# Patient Record
Sex: Female | Born: 1980
Health system: Southern US, Community
[De-identification: ages and names within clinical notes are randomized; demographics above are authoritative.]

## PROBLEM LIST (undated history)

## (undated) DIAGNOSIS — E785 Hyperlipidemia, unspecified: Secondary | ICD-10-CM

## (undated) DIAGNOSIS — D72829 Elevated white blood cell count, unspecified: Secondary | ICD-10-CM

## (undated) DIAGNOSIS — O26619 Liver and biliary tract disorders in pregnancy, unspecified trimester: Secondary | ICD-10-CM

## (undated) DIAGNOSIS — F419 Anxiety disorder, unspecified: Secondary | ICD-10-CM

## (undated) DIAGNOSIS — K831 Obstruction of bile duct: Secondary | ICD-10-CM

## (undated) DIAGNOSIS — O26649 Intrahepatic cholestasis of pregnancy, unspecified trimester: Secondary | ICD-10-CM

## (undated) DIAGNOSIS — T7840XA Allergy, unspecified, initial encounter: Secondary | ICD-10-CM

## (undated) HISTORY — DX: Obstruction of bile duct: K83.1

## (undated) HISTORY — DX: Allergy, unspecified, initial encounter: T78.40XA

## (undated) HISTORY — PX: WISDOM TOOTH EXTRACTION: SHX21

## (undated) HISTORY — DX: Intrahepatic cholestasis of pregnancy, unspecified trimester: O26.649

## (undated) HISTORY — DX: Elevated white blood cell count, unspecified: D72.829

## (undated) HISTORY — DX: Anxiety disorder, unspecified: F41.9

## (undated) HISTORY — DX: Liver and biliary tract disorders in pregnancy, unspecified trimester: O26.619

---

## 2012-06-18 LAB — HM PAP SMEAR: HM PAP: NEGATIVE

## 2016-04-11 ENCOUNTER — Encounter: Payer: Self-pay | Admitting: Osteopathic Medicine

## 2016-04-11 ENCOUNTER — Other Ambulatory Visit: Payer: Self-pay | Admitting: Osteopathic Medicine

## 2016-04-11 ENCOUNTER — Ambulatory Visit (INDEPENDENT_AMBULATORY_CARE_PROVIDER_SITE_OTHER): Payer: BLUE CROSS/BLUE SHIELD | Admitting: Osteopathic Medicine

## 2016-04-11 VITALS — BP 118/77 | HR 69 | Ht 65.0 in | Wt 148.0 lb

## 2016-04-11 DIAGNOSIS — Z79899 Other long term (current) drug therapy: Secondary | ICD-10-CM

## 2016-04-11 DIAGNOSIS — F411 Generalized anxiety disorder: Secondary | ICD-10-CM

## 2016-04-11 DIAGNOSIS — R748 Abnormal levels of other serum enzymes: Secondary | ICD-10-CM | POA: Diagnosis not present

## 2016-04-11 DIAGNOSIS — Z113 Encounter for screening for infections with a predominantly sexual mode of transmission: Secondary | ICD-10-CM | POA: Diagnosis not present

## 2016-04-11 DIAGNOSIS — Z13220 Encounter for screening for lipoid disorders: Secondary | ICD-10-CM | POA: Diagnosis not present

## 2016-04-11 LAB — CBC WITH DIFFERENTIAL/PLATELET
BASOS ABS: 83 {cells}/uL (ref 0–200)
Basophils Relative: 1 %
EOS PCT: 5 %
Eosinophils Absolute: 415 cells/uL (ref 15–500)
HEMATOCRIT: 45.5 % — AB (ref 35.0–45.0)
HEMOGLOBIN: 14.9 g/dL (ref 11.7–15.5)
LYMPHS ABS: 1992 {cells}/uL (ref 850–3900)
LYMPHS PCT: 24 %
MCH: 32.5 pg (ref 27.0–33.0)
MCHC: 32.7 g/dL (ref 32.0–36.0)
MCV: 99.3 fL (ref 80.0–100.0)
MPV: 11.5 fL (ref 7.5–12.5)
Monocytes Absolute: 913 cells/uL (ref 200–950)
Monocytes Relative: 11 %
NEUTROS PCT: 59 %
Neutro Abs: 4897 cells/uL (ref 1500–7800)
Platelets: 277 10*3/uL (ref 140–400)
RBC: 4.58 MIL/uL (ref 3.80–5.10)
RDW: 12.9 % (ref 11.0–15.0)
WBC: 8.3 10*3/uL (ref 3.8–10.8)

## 2016-04-11 LAB — COMPLETE METABOLIC PANEL WITH GFR
ALBUMIN: 4.7 g/dL (ref 3.6–5.1)
ALK PHOS: 52 U/L (ref 33–115)
ALT: 69 U/L — ABNORMAL HIGH (ref 6–29)
AST: 39 U/L — ABNORMAL HIGH (ref 10–30)
BILIRUBIN TOTAL: 0.7 mg/dL (ref 0.2–1.2)
BUN: 11 mg/dL (ref 7–25)
CALCIUM: 9.4 mg/dL (ref 8.6–10.2)
CHLORIDE: 101 mmol/L (ref 98–110)
CO2: 25 mmol/L (ref 20–31)
Creat: 0.73 mg/dL (ref 0.50–1.10)
GFR, Est African American: >89 mL/min (ref >=60)
GLUCOSE: 77 mg/dL (ref 65–99)
POTASSIUM: 4.1 mmol/L (ref 3.5–5.3)
Sodium: 136 mmol/L (ref 135–146)
Total Protein: 7.2 g/dL (ref 6.1–8.1)

## 2016-04-11 LAB — LIPID PANEL
Cholesterol: 272 mg/dL — ABNORMAL HIGH (ref 125–200)
HDL: 77 mg/dL (ref >=46)
LDL Cholesterol: 180 mg/dL — ABNORMAL HIGH (ref <130)
Total CHOL/HDL Ratio: 3.5 Ratio (ref <=5.0)
Triglycerides: 76 mg/dL (ref <150)
VLDL: 15 mg/dL (ref <30)

## 2016-04-11 LAB — TSH: TSH: 1.65 m[IU]/L

## 2016-04-11 LAB — POCT URINE PREGNANCY: Preg Test, Ur: NEGATIVE

## 2016-04-11 MED ORDER — SERTRALINE HCL 50 MG PO TABS: 50.0000 mg | ORAL_TABLET | Freq: Every day | ORAL | Status: DC

## 2016-04-11 MED ORDER — BUSPIRONE HCL 10 MG PO TABS: 5.0000 mg | ORAL_TABLET | Freq: Three times a day (TID) | ORAL | Status: DC | PRN

## 2016-04-11 NOTE — Patient Instructions (Addendum)
DR. Mardelle MatteALEXANDER'S IMPORTANT  INFORMATION FOR NEW PATIENTS!  PLEASE REVIEW CAREFULLY!    FOLLOW-UP!   Let's plan to follow-up in the office in 4 - 6 weeks for recheck of Anxiety.   Long term, let's plan to follow-up every 6-12 months for management/monitoring of chronic medical issues such as Anxiety, as well as management of your prescription medications. More freqtuent visits may be needed until we get you on a stable dose of anti-anxiety medicines which are working for you.   Please don't hesitate to make an appointment sooner if you're having any acute concerns or problems!  REGARDING PRESCRIPTION MEDICATIONS  Please let your pharmacy know when you are running low on medications/refills (do not wait until you are out of medicines). Your pharmacy will send our office a request for the appropriate medications. Please allow our office 2-3 business days to process the needed refills.   For controlled substances, you may be asked to schedule an office visit and/or undergo a random urine drug screen for continuation of such medications.   REGARDING ANY CARE YOU RECEIVE OUTSIDE OUR OFFICE  At any visits to any specialists, or if you receive vaccines anywhere outside our office, please provide our clinic information so that they can forward us any records, including any tests which are done, changes to your medications, or new vaccinations.   If you are a woman who has Pap testing and/or Mammograms done at your OBGYN, please have them forward your results to us!   Also, if you are ever treated in an emergency room or if you are admitted to the hospital, we encourage our patients to schedule a follow-up visit any time they are treated for a serious illness or injury.   This allows all your physicians to communicate effectively, putting your primary care doctor at the center of your medical care and allowing us to coordinate your care.  REGARDING PREVENTIVE CARE & WELLNESS, AKA "ANNUAL  PHYSICAL"  Let's plan to follow-up here in the office in 12 months for ANNUAL WELLNESS & PREVENTIVE CARE PHYSICAL.   This visit is very important to make sure we talk with our patients about all recommended cancer screenings, vaccines, birth control (if desired) and screenings for other diseases based on your personal/family history. Plus, this visit should be completely covered under your insurance!   We also like to talk about any changes to your health over the past year and make sure any chronic conditions are well-controlled.    If you would like to, you can get routine lab work done 2 - 3 days before your physical so that we can go over the results in person at your appointment. Please call our office a week before that appointment so we can make sure the lab has the appropriate orders for your blood draw.   Please note: insurance should completely cover one preventive care visit annually, and they should completely cover most tests associated with preventive care such as routine labs, mammograms, colon cancer screening, etc. If you have any other medical concerns to address at the time of your annual wellness visit, you may be asked to reschedule your annual physical, or schedule a separate visit to address other medical concerns. Or, you may be billed for care related to "problem-based visit" in addition to your "preventive care visit." If you have questions about this, please contact our office, your insurance company, or [facility] billing department.   REGARDING ANY FORMS NEEDING YOUR DOCTOR'S SIGNATURE  If you ever  have any paperwork which needs to be completed by your PCP, you may be asked to come to the office if that paperwork requires a complex review of your medical history. We want to make sure everything is completed to your satisfaction and is completed correctly the first time, particularly with FMLA or other employment/legal matters!      Please let us know if there is  anything else we can do for you. Take care! -Dr. Mervyn SkeetersA.

## 2016-04-11 NOTE — Addendum Note (Signed)
Addended by: Deirdre PippinsALEXANDER, Kolten Ryback M on: 04/11/2016 01:35 PM   Modules accepted: Level of Service

## 2016-04-11 NOTE — Progress Notes (Signed)
HPI: Anna Hines is a 35 y.o. female who presents [facility] [place] today for chief complaint of:  Chief Complaint  Patient presents with  . Establish Care    ANNUAL  . Anxiety     ANXIETY . Quality: anxious, trouble focusing . Duration: longstanding anxiety/depression issues  . Modifying factors: previously prescribed Sertraline and was doing kind of ok on this. Off for a few months. Not sure dose.  . Assoc signs/symptoms: marital problems  CONTRACEPTION - long-term partner has vasectomy  PREVENTIVE CARE - reviewed as below   Past medical, social and family history reviewed: History reviewed. No pertinent past medical history. History reviewed. No pertinent past surgical history. Social History  Substance Use Topics  . Smoking status: Never Smoker   . Smokeless tobacco: Not on file  . Alcohol Use: Not on file   Family History  Problem Relation Age of Onset  . Cancer Mother     CERVICAL  . Hyperlipidemia Father   . Alcohol abuse Maternal Grandmother   . Cancer Maternal Grandmother     BREAST  . Stroke Maternal Grandmother   . Diabetes Maternal Grandfather   . Hyperlipidemia Paternal Grandmother   . Heart attack Paternal Grandfather     No current outpatient prescriptions on file.   No current facility-administered medications for this visit.   No Known Allergies    Review of Systems: CONSTITUTIONAL:  No  fever, no chills, No recent illness, No unintentional weight changes HEAD/EYES/EARS/NOSE/THROAT: No  headache, no vision change, no hearing change, No sore throat, No  sinus pressure CARDIAC: No  chest pain, No  pressure, No palpitations, No  orthopnea RESPIRATORY: No  cough, No  shortness of breath/wheeze GASTROINTESTINAL: No  nausea, No  vomiting, No  abdominal pain, No  blood in stool, No  diarrhea, No  constipation  MUSCULOSKELETAL: No  myalgia/arthralgia GENITOURINARY: No  incontinence, No  abnormal genital  bleeding/discharge SKIN: No  rash/wounds/concerning lesions HEM/ONC: No  easy bruising/bleeding, No  abnormal lymph node ENDOCRINE: No polyuria/polydipsia/polyphagia, No  heat/cold intolerance  NEUROLOGIC: No  weakness, No  dizziness, No  slurred speech PSYCHIATRIC: No  concerns with depression, (+) concerns with anxiety, No sleep problems  Exam:  BP 118/77 mmHg  Pulse 69  Ht 5\' 5"  (1.651 m)  Wt 148 lb (67.132 kg)  BMI 24.63 kg/m2 Constitutional: VS see above. General Appearance: alert, well-developed, well-nourished, NAD Eyes: Normal lids and conjunctive, non-icteric sclera, PERRLA Ears, Nose, Mouth, Throat: MMM, Normal external inspection ears/nares/mouth/lips/gums, TM normal bilaterally. Pharynx/tonsils no erythema, no exudate. Nasal mucosa normal.  Neck: No masses, trachea midline. No thyroid enlargement. No tenderness/mass appreciated. No lymphadenopathy Respiratory: Normal respiratory effort. no wheeze, no rhonchi, no rales Cardiovascular: S1/S2 normal, no murmur, no rub/gallop auscultated. RRR. No lower extremity edema. Gastrointestinal: Nontender, no masses. No hepatomegaly, no splenomegaly. No hernia appreciated. Bowel sounds normal. Rectal exam deferred.  Musculoskeletal: Gait normal. No clubbing/cyanosis of digits.  Neurological: No cranial nerve deficit on limited exam. Motor and sensation intact and symmetric Skin: warm, dry, intact. No rash/ulcer. No concerning nevi or subq nodules on limited exam.   Psychiatric: Normal judgment/insight. Normal mood and affect. Oriented x3.    No results found for this or any previous visit (from the past 72 hour(s)).  No results found.   ASSESSMENT/PLAN:  Generalized anxiety disorder - Plan: CBC with Differential/Platelet, COMPLETE METABOLIC PANEL WITH GFR, TSH, sertraline (ZOLOFT) 50 MG tablet, busPIRone (BUSPAR) 10 MG tablet  Routine screening for  STI (sexually transmitted infection) - Plan: HIV antibody, GC/chlamydia probe amp,  urine, Trichomonas vaginalis, RNA  Lipid screening - Plan: Lipid panel  Medication management - Plan: CBC with Differential/Platelet, COMPLETE METABOLIC PANEL WITH GFR, POCT urine pregnancy   FEMALE PREVENTIVE CARE  ANNUAL SCREENING/COUNSELING Tobacco - yesCurrent  1/5 pack / day  Alcohol - social drinker Diet/Exercise - HEALTHY HABITS DISCUSSED TO DECREASE CV RISK Depression - PQH2 Negative Domestic violence concerns - no HTN SCREENING - SEE VITALS Vaccination status - SEE BELOW  SEXUAL HEALTH Sexually active in the past year - yes With - Yes with female. STI - The patient denies history of sexually transmitted disease. STI testing today? - yes  INFECTIOUS DISEASE SCREENING HIV - all adults 15-65 - needs GC/CT - sexually active - needs HepC - DOB [birth year] - does not need TB - does not need  DISEASE SCREENING Lipid - needs DM2 - does not need Osteoporosis - does not need  CANCER SCREENING Cervical - does not need - WE NEED RECORDS Breast - does not need Lung - does not need Colon - does not need  ADULT VACCINATION Influenza - was offered and declined by the patient Td - already has, per patient HPV - was not indicated Zoster - was not indicated Pneumonia - was not indicated  OTHER Fall - exercise and Vit D age 39+ - does not need Consider ASA - age 33-59 - does not need    All questions were answered. Visit summary with medication list and pertinent instructions was printed for patient to review. ER/RTC precautions were reviewed with the patient. Return in about 6 weeks (around 05/23/2016), or sooner if needed, for MEDICATION MANAGEMENT, RECHECK ANXIETY.

## 2016-04-12 LAB — GC/CHLAMYDIA PROBE AMP
CT PROBE, AMP APTIMA: NOT DETECTED
GC PROBE AMP APTIMA: NOT DETECTED

## 2016-04-12 LAB — HIV ANTIBODY (ROUTINE TESTING W REFLEX): HIV: NONREACTIVE

## 2016-04-12 LAB — TRICHOMONAS VAGINALIS, PROBE AMP: TRICHOMONAS VAGINALIS PROBE APTIMA: NEGATIVE

## 2016-04-15 NOTE — Addendum Note (Signed)
Addended by: Deirdre PippinsALEXANDER, Cesiah Westley M on: 04/15/2016 08:31 AM   Modules accepted: Orders

## 2016-04-18 ENCOUNTER — Encounter: Payer: Self-pay | Admitting: Osteopathic Medicine

## 2016-04-18 DIAGNOSIS — Z8639 Personal history of other endocrine, nutritional and metabolic disease: Secondary | ICD-10-CM | POA: Insufficient documentation

## 2016-04-18 DIAGNOSIS — Z7189 Other specified counseling: Secondary | ICD-10-CM | POA: Insufficient documentation

## 2016-05-02 ENCOUNTER — Encounter: Payer: Self-pay | Admitting: Osteopathic Medicine

## 2016-05-23 ENCOUNTER — Ambulatory Visit: Payer: BLUE CROSS/BLUE SHIELD | Admitting: Osteopathic Medicine

## 2016-05-23 DIAGNOSIS — Z5329 Procedure and treatment not carried out because of patient's decision for other reasons: Secondary | ICD-10-CM | POA: Insufficient documentation

## 2016-05-23 DIAGNOSIS — Z91199 Patient's noncompliance with other medical treatment and regimen due to unspecified reason: Secondary | ICD-10-CM | POA: Insufficient documentation

## 2016-09-03 ENCOUNTER — Encounter: Payer: Self-pay | Admitting: Osteopathic Medicine

## 2016-09-03 ENCOUNTER — Ambulatory Visit (INDEPENDENT_AMBULATORY_CARE_PROVIDER_SITE_OTHER): Payer: BLUE CROSS/BLUE SHIELD | Admitting: Osteopathic Medicine

## 2016-09-03 VITALS — BP 126/81 | HR 91 | Ht 65.0 in | Wt 153.0 lb

## 2016-09-03 DIAGNOSIS — Z30011 Encounter for initial prescription of contraceptive pills: Secondary | ICD-10-CM | POA: Insufficient documentation

## 2016-09-03 DIAGNOSIS — Z113 Encounter for screening for infections with a predominantly sexual mode of transmission: Secondary | ICD-10-CM

## 2016-09-03 LAB — HIV ANTIBODY (ROUTINE TESTING W REFLEX): HIV 1&2 Ab, 4th Generation: NONREACTIVE

## 2016-09-03 LAB — POCT URINE PREGNANCY: PREG TEST UR: NEGATIVE

## 2016-09-03 MED ORDER — NORGESTIM-ETH ESTRAD TRIPHASIC 0.18/0.215/0.25 MG-25 MCG PO TABS: 1.0000 | ORAL_TABLET | Freq: Every day | ORAL | 3 refills | Status: DC

## 2016-09-03 NOTE — Progress Notes (Signed)
HPI: Anna FriezeSarah R Hines is a 35 y.o. female  who [facility] today, 09/03/16,  for chief complaint of:  Chief Complaint  Patient presents with  . Follow-up    anxiety     . Stopped taking Zoloft, has gotten out of a bad marriage and no longer having anxiety problems.  . Today would like to discuss initiation of birth control pills, previously on Ortho Tri-Cyclen Lo and did well with this medication. Would also like screening for sexually transmitted infections.     Past medical, surgical, social and family history reviewed: Patient Active Problem List   Diagnosis Date Noted  . Failure to attend appointment 05/23/2016  . Counseling on health promotion and disease prevention 04/18/2016  . History of hyperlipidemia 04/18/2016  . Generalized anxiety disorder 04/11/2016   No past surgical history on file. Social History  Substance Use Topics  . Smoking status: Former Games developermoker  . Smokeless tobacco: Not on file  . Alcohol use Not on file   Family History  Problem Relation Age of Onset  . Cancer Mother     CERVICAL  . Hyperlipidemia Father   . Alcohol abuse Maternal Grandmother   . Cancer Maternal Grandmother     BREAST  . Stroke Maternal Grandmother   . Diabetes Maternal Grandfather   . Hyperlipidemia Paternal Grandmother   . Heart attack Paternal Grandfather      Current medication list and allergy/intolerance information reviewed:   Current Outpatient Prescriptions on File Prior to Visit  Medication Sig Dispense Refill  . busPIRone (BUSPAR) 10 MG tablet Take 0.5-1 tablets (5-10 mg total) by mouth 3 (three) times daily as needed (anxiety). 30 tablet 0   No current facility-administered medications on file prior to visit.    No Known Allergies    Review of Systems:  Constitutional: No recent illness  Cardiac: No  chest pain  Respiratory:  No  shortness of breath.   Psychiatric: No  concerns with depression, No   concerns with anxiety  Exam:  BP 126/81   Pulse 91   Ht 5\' 5"  (1.651 m)   Wt 153 lb (69.4 kg)   BMI 25.46 kg/m   Constitutional: VS see above. General Appearance: alert, well-developed, well-nourished, NAD  Eyes: Normal lids and conjunctive, non-icteric sclera  Ears, Nose, Mouth, Throat: MMM, Normal external inspection ears/nares/mouth/lips/gums.  Neck: No masses, trachea midline.   Respiratory: Normal respiratory effort.   Musculoskeletal: Gait normal. Symmetric and independent movement of all extremities  Neurological: Normal balance/coordination. No tremor.  Skin: warm, dry, intact.   Psychiatric: Normal judgment/insight. Normal mood and affect. Oriented x3.    Results for orders placed or performed in visit on 09/03/16 (from the past 72 hour(s))  POCT urine pregnancy     Status: None   Collection Time: 09/03/16  9:14 AM  Result Value Ref Range   Preg Test, Ur Negative Negative    No results found.  GAD 7 : Generalized Anxiety Score 09/03/2016  Nervous, Anxious, on Edge 1  Control/stop worrying 0  Worry too much - different things 1  Trouble relaxing 0  Restless 0  Easily annoyed or irritable 0  Afraid - awful might happen 0  Total GAD 7 Score 2  Anxiety Difficulty Not difficult at all      Dep[facility] screen [facility] 09/03/2016  Decreased Interest 0  Down, Depressed, Hopeless 0  PHQ - 2 Score 0  Altered sleeping 0  Tired, decreased energy 0  Change in appetite 0  Feeling bad or failure about yourself  0  Trouble concentrating 0  Moving slowly or fidgety/restless 0  Suicidal thoughts 0  PHQ-9 Score 0     ASSESSMENT/PLAN:   Oral contraception initiation - Plan: POCT urine pregnancy, Norgestimate-Ethinyl Estradiol Triphasic (ORTHO TRI-CYCLEN LO) 0.18/0.215/0.25 MG-25 MCG tab  Routine screening for STI (sexually transmitted infection) - Plan: HIV antibody, GC/Chlamydia Probe Amp, Urine cytology ancillary only      Visit summary with medication  list and pertinent instructions was printed for patient to review. All questions at time of visit were answered - patient instructed to contact office with any additional concerns. ER/RTC precautions were reviewed with the patient. Follow-up plan: Return for Annual physical June 2018, sooner if needed.

## 2016-09-04 LAB — GC/CHLAMYDIA PROBE AMP
CT PROBE, AMP APTIMA: NOT DETECTED
GC Probe RNA: NOT DETECTED

## 2016-10-20 ENCOUNTER — Emergency Department (INDEPENDENT_AMBULATORY_CARE_PROVIDER_SITE_OTHER)
Admission: EM | Admit: 2016-10-20 | Discharge: 2016-10-20 | Disposition: A | Discharge status: Home / Self Care | Payer: BLUE CROSS/BLUE SHIELD | Source: Home / Self Care | Attending: Family Medicine | Admitting: Family Medicine

## 2016-10-20 ENCOUNTER — Encounter: Payer: Self-pay | Admitting: Emergency Medicine

## 2016-10-20 DIAGNOSIS — R062 Wheezing: Secondary | ICD-10-CM

## 2016-10-20 DIAGNOSIS — J069 Acute upper respiratory infection, unspecified: Secondary | ICD-10-CM | POA: Diagnosis not present

## 2016-10-20 HISTORY — DX: Hyperlipidemia, unspecified: E78.5

## 2016-10-20 MED ORDER — AZITHROMYCIN 250 MG PO TABS: 250.0000 mg | ORAL_TABLET | Freq: Every day | ORAL | 0 refills | Status: DC

## 2016-10-20 MED ORDER — ALBUTEROL SULFATE HFA 108 (90 BASE) MCG/ACT IN AERS: 1.0000 | INHALATION_SPRAY | Freq: Four times a day (QID) | RESPIRATORY_TRACT | 0 refills | Status: DC | PRN

## 2016-10-20 MED ORDER — BENZONATATE 100 MG PO CAPS: 100.0000 mg | ORAL_CAPSULE | Freq: Three times a day (TID) | ORAL | 0 refills | Status: DC

## 2016-10-20 NOTE — ED Triage Notes (Signed)
Patient reports onset of congestion and cough 5 days ago; now cough leads to wheezing. No known fever or aches. Last OTC at bedtime last night.

## 2016-10-20 NOTE — ED Provider Notes (Signed)
CSN: [id number]     Arrival date & time 10/20/16  1147 History   First MD Initiated Contact with Patient 10/20/16 1218     Chief Complaint  Patient presents with  . Cough  . Wheezing   (Consider location/radiation/quality/duration/timing/severity/associated sxs/prior Treatment) HPI  Anna Hines is a 35 y.o. female presenting to UC with c/o worsening cough and congestion that started 5 days ago, associated wheeze started last night with chest tightness. She has tried OTC cough/cold medication with mild temporary relief.  No hx of asthma or COPD.  Denies fever, chills, aches, n/v/d.     Past Medical History:  Diagnosis Date  . Hyperlipidemia    History reviewed. No pertinent surgical history. Family History  Problem Relation Age of Onset  . Cancer Mother     CERVICAL  . Hyperlipidemia Father   . Alcohol abuse Maternal Grandmother   . Cancer Maternal Grandmother     BREAST  . Stroke Maternal Grandmother   . Diabetes Maternal Grandfather   . Hyperlipidemia Paternal Grandmother   . Heart attack Paternal Grandfather    Social History  Substance Use Topics  . Smoking status: Former Games developer  . Smokeless tobacco: Never Used  . Alcohol use Not on file   OB History    No data available     Review of Systems  Constitutional: Negative for chills and fever.  HENT: Positive for congestion and rhinorrhea. Negative for ear pain, sore throat, trouble swallowing and voice change.   Respiratory: Positive for cough, chest tightness and wheezing. Negative for shortness of breath.   Cardiovascular: Negative for chest pain and palpitations.  Gastrointestinal: Negative for abdominal pain, diarrhea, nausea and vomiting.  Musculoskeletal: Negative for arthralgias, back pain and myalgias.  Skin: Negative for rash.    Allergies  Patient has no known allergies.  Home Medications   Prior to Admission medications   Medication Sig Start Date End Date Taking? Authorizing Provider   albuterol (PROVENTIL HFA;VENTOLIN HFA) 108 (90 Base) MCG/ACT inhaler Inhale 1-2 puffs into the lungs every 6 (six) hours as needed for wheezing or shortness of breath. 10/20/16   Junius Finner, PA-C  azithromycin (ZITHROMAX) 250 MG tablet Take 1 tablet (250 mg total) by mouth daily. Take first 2 tablets together, then 1 every day until finished. 10/20/16   Junius Finner, PA-C  benzonatate (TESSALON) 100 MG capsule Take 1-2 capsules (100-200 mg total) by mouth every 8 (eight) hours. 10/20/16   Junius Finner, PA-C  busPIRone (BUSPAR) 10 MG tablet Take 0.5-1 tablets (5-10 mg total) by mouth 3 (three) times daily as needed (anxiety). 04/11/16   Sunnie Nielsen, DO  Norgestimate-Ethinyl Estradiol Triphasic (ORTHO TRI-CYCLEN LO) 0.18/0.215/0.25 MG-25 MCG tab Take 1 tablet by mouth daily. 09/03/16   Sunnie Nielsen, DO   Meds Ordered and Administered this Visit  Medications - No data to display  BP 115/77 (BP Location: Left Arm)   Pulse 95   Temp 98.6 F (37 C) (Oral)   Resp 16   Ht  (1.651 m)   Wt 150 lb (68 kg)   LMP 10/06/2016 (Exact Date)   SpO2 96%   BMI 24.96 kg/m  No data found.   Physical Exam  Constitutional: She is oriented to person, place, and time. She appears well-developed and well-nourished. No distress.  HENT:  Head: Normocephalic and atraumatic.  Right Ear: Tympanic membrane normal.  Left Ear: Tympanic membrane normal.  Nose: Mucosal edema present.  Mouth/Throat: Uvula is midline, oropharynx is clear and  moist and mucous membranes are normal.  Eyes: EOM are normal.  Neck: Normal range of motion. Neck supple.  Cardiovascular: Normal rate and regular rhythm.   Pulmonary/Chest: Effort normal. No stridor. No respiratory distress. She has wheezes ( Left upper lung field ). She has no rales.  Musculoskeletal: Normal range of motion.  Lymphadenopathy:    She has no cervical adenopathy.  Neurological: She is alert and oriented to person, place, and time.  Skin:  Skin is warm and dry. She is not diaphoretic.  Psychiatric: She has a normal mood and affect. Her behavior is normal.  Nursing note and vitals reviewed.   Urgent Care Course   Clinical Course     Procedures (including critical care time)  Labs Review Labs Reviewed - No data to display  Imaging Review No results found.    MDM   1. Upper respiratory tract infection, unspecified type   2. Wheezing    Pt c/o worsening cough for 1 week, developing wheeze.  No respiratory distress. O2 Sat 96% on RA  Rx: Tessalon, azithromycin, albuterol  Encouraged f/u with PCP in 1 week if not improving.     Junius Finnerrin O'Malley, PA-C 10/20/16 1535

## 2016-11-26 ENCOUNTER — Ambulatory Visit (INDEPENDENT_AMBULATORY_CARE_PROVIDER_SITE_OTHER): Payer: BLUE CROSS/BLUE SHIELD | Admitting: Osteopathic Medicine

## 2016-11-26 ENCOUNTER — Ambulatory Visit (INDEPENDENT_AMBULATORY_CARE_PROVIDER_SITE_OTHER): Payer: BLUE CROSS/BLUE SHIELD

## 2016-11-26 VITALS — BP 108/71 | HR 85 | Temp 98.2°F | Ht 65.0 in | Wt 156.0 lb

## 2016-11-26 DIAGNOSIS — J039 Acute tonsillitis, unspecified: Secondary | ICD-10-CM

## 2016-11-26 DIAGNOSIS — R062 Wheezing: Secondary | ICD-10-CM

## 2016-11-26 DIAGNOSIS — R05 Cough: Secondary | ICD-10-CM | POA: Diagnosis not present

## 2016-11-26 DIAGNOSIS — R0602 Shortness of breath: Secondary | ICD-10-CM | POA: Diagnosis not present

## 2016-11-26 MED ORDER — AMOXICILLIN-POT CLAVULANATE 875-125 MG PO TABS: 1.0000 | ORAL_TABLET | Freq: Two times a day (BID) | ORAL | 0 refills | Status: DC

## 2016-11-26 MED ORDER — PREDNISONE 20 MG PO TABS: 20.0000 mg | ORAL_TABLET | Freq: Two times a day (BID) | ORAL | 0 refills | Status: DC

## 2016-11-26 NOTE — Progress Notes (Addendum)
HPI: Anna Hines is a 36 y.o. female who presents to [facility] 11/26/16 for chief complaint of:  Chief Complaint  Patient presents with  . Wheezing    Acute Illness: . Context:  Went to [place], see records below. She is feeling much better except feeling some "wheezing"  . Location: feels like it's in the throat  . Quality: whistling type noise in the throat, no trouble swallowing, feels like throat is swollen on R side, feels tender with swallowing but that is new, last Thursday that started  . Assoc signs/symptoms: see ROS . Modifying factors: has tried the following OTC/Rx medications: Allegra for the throat  Records reviewed from 10/20/2016: Seen for acute illness in urgent care. Cough, congestion 5 days associated with wheeze/chest tightness. Diagnosed with upper respiratory tract infection, unspecified. Prescribed Tessalon, azithromycin, albuterol.   Past medical, social and family history reviewed.   Immune compromising conditions or other risk factors: none  Current medications and allergies reviewed.     Review of Systems:  Constitutional: No  fever/chills  HEENT: Yes headache but thinks due to quit caffeine, on sinus headache,  Yes  sore throat, Yes  swollen glands  Cardiovascular: No chest pain, no palpitations   Respiratory: very occasional cough, when working out has experienced some shortness of breath   Gastrointestinal: No  nausea, No  vomiting,  No  diarrhea  Musculoskeletal:   No  myalgia/arthralgia  Skin/Integument:  No  rash   Detailed Exam:  BP 108/71   Pulse 85   Temp 98.2 F (36.8 C) (Oral)   Ht 5\' 5"  (1.651 m)   Wt 156 lb (70.8 kg)   SpO2 96%   BMI 25.96 kg/m   Constitutional:   VSS, see above.   General Appearance: alert, well-developed, well-nourished, NAD  Eyes:   Normal lids and conjunctive, non-icteric sclera  Ears, Nose, Mouth, Throat:   Relatively enlarged tonsil on R side, posterior  pharynx no edema/mass  No trismus, no drooling  Normal voice  Normal external inspection ears/nares  Normal mouth/lips/gums, MMM  normal TM  posterior pharynx with erythema, without exudate  nasal mucosa normal  Skin:  Normal inspection, no rash or concerning lesions noted on limited exam  Neck:   No masses, trachea midline. abnormal - nonenlarged but tender ant cervical LN on R lymph node, L LN nonpainful   Respiratory:   Normal respiratory effort.   Louisa Second  wheeze/rhonchi/rales  Cardiovascular:   S1/S2 normal, no murmur/rub/gallop auscultated. RRR.  Dg Chest 2 View  Result Date: 11/26/2016 CLINICAL DATA:  Cough, shortness of breath, wheezing EXAM: CHEST  2 VIEW COMPARISON:  None. FINDINGS: The heart size and mediastinal contours are within normal limits. Both lungs are clear. The visualized skeletal structures are unremarkable. IMPRESSION: No active cardiopulmonary disease. Electronically Signed   By: Elige Ko   On: 11/26/2016 09:10    ASSESSMENT/PLAN: Reassuring afebrile and nonsevere symptoms though certainly will need to monitor closely. Consider w/u for exercise induced asthma. ER precautions reviewed in detail with the patient. Will need CT if any worsening symptoms concerning for deeper infection but at this time I'm less suspicious of abscess, epiglottitis, or other such as HIV or mono.   Tonsillitis - Plan: amoxicillin-clavulanate (AUGMENTIN) 875-125 MG tablet  Wheeze - Plan: DG Chest 2 View, predniSONE (DELTASONE) 20 MG tablet     Patient Instructions  PLAN:  Diagnosis tonsillitis, we are starting antibiotics and steroids with plan to recheck in the office  in 2-3 days, sooner if needed  If worse (fever, severe sore throat, pain with jaw movement, or other concerns), need to know ASAP so we can reevaluate!  Continue inhaler when needed   Xray today to evaluate for walking pneumonia or other cause of shortness of breath symptoms  Will consider  further testing for possible asthma based on how you respond to treatment    Visit summary was printed for the patient with medications and pertinent instructions for patient to review. ER/RTC precautions reviewed. All questions answered. Return in about 2 days (around 11/28/2016) for RECHECK THROAT.

## 2016-11-26 NOTE — Patient Instructions (Signed)
PLAN:  Diagnosis tonsillitis, we are starting antibiotics and steroids with plan to recheck in the office in 2-3 days, sooner if needed  If worse (fever, severe sore throat, pain with jaw movement, or other concerns), need to know ASAP so we can reevaluate!  Continue inhaler when needed   Xray today to evaluate for walking pneumonia or other cause of shortness of breath symptoms  Will consider further testing for possible asthma based on how you respond to treatment

## 2017-01-27 ENCOUNTER — Other Ambulatory Visit: Payer: Self-pay | Admitting: Emergency Medicine

## 2017-01-27 DIAGNOSIS — Z30011 Encounter for initial prescription of contraceptive pills: Secondary | ICD-10-CM

## 2017-01-27 MED ORDER — NORGESTIM-ETH ESTRAD TRIPHASIC 0.18/0.215/0.25 MG-25 MCG PO TABS: 1.0000 | ORAL_TABLET | Freq: Every day | ORAL | 3 refills | Status: DC

## 2017-08-11 ENCOUNTER — Ambulatory Visit (INDEPENDENT_AMBULATORY_CARE_PROVIDER_SITE_OTHER): Payer: BLUE CROSS/BLUE SHIELD | Admitting: Osteopathic Medicine

## 2017-08-11 ENCOUNTER — Encounter: Payer: Self-pay | Admitting: Osteopathic Medicine

## 2017-08-11 VITALS — BP 118/80 | HR 89 | Ht 65.0 in | Wt 153.0 lb

## 2017-08-11 DIAGNOSIS — S6992XA Unspecified injury of left wrist, hand and finger(s), initial encounter: Secondary | ICD-10-CM | POA: Diagnosis not present

## 2017-08-11 DIAGNOSIS — F419 Anxiety disorder, unspecified: Secondary | ICD-10-CM | POA: Insufficient documentation

## 2017-08-11 MED ORDER — ESCITALOPRAM OXALATE 10 MG PO TABS: 10.0000 mg | ORAL_TABLET | Freq: Every day | ORAL | 1 refills | Status: DC

## 2017-08-11 MED ORDER — ALPRAZOLAM 0.5 MG PO TABS: 0.2500 mg | ORAL_TABLET | Freq: Two times a day (BID) | ORAL | 0 refills | Status: DC | PRN

## 2017-08-11 NOTE — Progress Notes (Signed)
HPI: Anna Hines is a 36 y.o. female  who presents [facility] today, 08/11/17,  for chief complaint of:  Chief Complaint  Patient presents with  . Follow-up    ANXIETY, Medication is not working.    Hx anxiety - stopped Zoloft last year - had gotten out of a bad marriage and felt it was no longer needed. Had restarted this sometime in the interim became off of it again. Did not tolerate buspirone. Has never been on any other anti-anxiety medications. Zoloft had some side effects, she just doesn't feel right on this medication. Reports recent significant panic episode while driving.   Also reports recent injury last week of left ring finger, smashed it while chasing after her loose dog. Is still somewhat swollen and tender to the touch, difficulty moving.   Past medical history, surgical history, social history and family history reviewed.  Patient Active Problem List   Diagnosis Date Noted  . Oral contraception initiation 09/03/2016  . Failure to attend appointment 05/23/2016  . Counseling on health promotion and disease prevention 04/18/2016  . History of hyperlipidemia 04/18/2016  . Generalized anxiety disorder 04/11/2016    Current medication list and allergy/intolerance information reviewed.   Current Outpatient Prescriptions on File Prior to Visit  Medication Sig Dispense Refill  . albuterol (PROVENTIL HFA;VENTOLIN HFA) 108 (90 Base) MCG/ACT inhaler Inhale 1-2 puffs into the lungs every 6 (six) hours as needed for wheezing or shortness of breath. 1 Inhaler 0  . Norgestimate-Ethinyl Estradiol Triphasic (ORTHO TRI-CYCLEN LO) 0.18/0.215/0.25 MG-25 MCG tab Take 1 tablet by mouth daily. 3 Package 3   No current facility-administered medications on file prior to visit.    No Known Allergies    Review of Systems:  Constitutional: No recent illness  Cardiac: No  chest pain, No  pressure, No palpitations  Respiratory:  No  shortness  of breath.   Neurologic: No  weakness, No  Dizziness  Psychiatric: No  concerns with depression, +concerns with anxiety  Exam:  BP 118/80   Pulse 89   Ht 5\' 5"  (1.651 m)   Wt 153 lb (69.4 kg)   BMI 25.46 kg/m   Constitutional: VS see above. General Appearance: alert, well-developed, well-nourished, NAD  Eyes: Normal lids and conjunctive, non-icteric sclera  Ears, Nose, Mouth, Throat: MMM, Normal external inspection ears/nares/mouth/lips/gums.  Neck: No masses, trachea midline.   Respiratory: Normal respiratory effort. no wheeze, no rhonchi, no rales  Cardiovascular: S1/S2 normal, no murmur, no rub/gallop auscultated. RRR.   Musculoskeletal: Gait normal. Symmetric and independent movement of all extremities. Left ring finger mildly swollen, normal passive the difficult active range of motion at DIP, no ecchymoses.  Neurological: Normal balance/coordination. No tremor.  Skin: warm, dry, intact.   Psychiatric: Normal judgment/insight. Normal mood and affect. Oriented x3.       GAD 7 : Generalized Anxiety Score 08/11/2017 09/03/2016  Nervous, Anxious, on Edge 2 1  Control/stop worrying 2 0  Worry too much - different things 2 1  Trouble relaxing 1 0  Restless 1 0  Easily annoyed or irritable 2 0  Afraid - awful might happen 2 0  Total GAD 7 Score 12 2  Anxiety Difficulty - Not difficult at all    [facility] 08/11/2017 09/03/2016  Decreased Interest 1 0  Down, Depressed, Hopeless 0 0  PHQ - 2 Score 1 0  Altered sleeping - 0  Tired, decreased energy - 0  Change in appetite -  0  Feeling bad or failure about yourself  - 0  Trouble concentrating - 0  Moving slowly or fidgety/restless - 0  Suicidal thoughts - 0  PHQ-9 Score - 0      ASSESSMENT/PLAN:   Anxiety - Plan: escitalopram (LEXAPRO) 10 MG tablet, ALPRAZolam (XANAX) 0.5 MG tablet  Injury of left ring finger, initial encounter - Plan: DG Finger Ring Left    Outpatient Encounter  Prescriptions as of 08/11/2017  Medication Sig  . albuterol (PROVENTIL HFA;VENTOLIN HFA) 108 (90 Base) MCG/ACT inhaler Inhale 1-2 puffs into the lungs every 6 (six) hours as needed for wheezing or shortness of breath.  . Norgestimate-Ethinyl Estradiol Triphasic (ORTHO TRI-CYCLEN LO) 0.18/0.215/0.25 MG-25 MCG tab Take 1 tablet by mouth daily.  [name] ALPRAZolam (XANAX) 0.5 MG tablet Take 0.5-1 tablets (0.25-0.5 mg total) by mouth 2 (two) times daily as needed for anxiety (Use sparingly to avoid tolerance/dependence). #15 for ninety days  . escitalopram (LEXAPRO) 10 MG tablet Take 1 tablet (10 mg total) by mouth daily.  . [DISCONTINUED] amoxicillin-clavulanate (AUGMENTIN) 875-125 MG tablet Take 1 tablet by mouth 2 (two) times daily. For 10 days (Patient not taking: Reported on 08/11/2017)  . [DISCONTINUED] busPIRone (BUSPAR) 10 MG tablet Take 0.5-1 tablets (5-10 mg total) by mouth 3 (three) times daily as needed (anxiety). (Patient not taking: Reported on 08/11/2017)  . [DISCONTINUED] predniSONE (DELTASONE) 20 MG tablet Take 1 tablet (20 mg total) by mouth 2 (two) times daily with a meal. (Patient not taking: Reported on 08/11/2017)   No facility-administered encounter medications on file as of 08/11/2017.      Patient Instructions   Anxiety toreatment:  Lexapro daily for maintenance/prevention   Alprazolam for severe panic symptoms   Finger:   Xray today to make sure no fracture, or in case need other imaging if not improving as expected  Keep splint on/ Ibuprofen as needed 400-600 mg 4 times per day   If finger is not better or if it gets worse, I would recommend follow-up with one of our sports medicine specialists (Dr Denyse Amass or Dr. Cherylann Parr Dr. Karie Schwalbe) for further evaluation in 2-4 weeks. Just call our office and ask for an appointment for sports medicine!      Follow-up plan: Return in about 4 weeks (around 09/08/2017) for recheck anxiety - sooner if needed .  Visit summary with  medication list and pertinent instructions was printed for patient to review, alert [place] if any changes needed. All questions at time of visit were answered - patient instructed to contact office with any additional concerns. ER/RTC precautions were reviewed with the patient and understanding verbalized.   Note: Total time spent 25 minutes, greater than 50% of the visit was spent face-to-face counseling and coordinating care for the following: The primary encounter diagnosis was Anxiety. A diagnosis of Injury of left ring finger, initial encounter was also pertinent to this visit.[name]

## 2017-08-11 NOTE — Patient Instructions (Signed)
  Anxiety toreatment:  Lexapro daily for maintenance/prevention   Alprazolam for severe panic symptoms   Finger:   Xray today to make sure no fracture, or in case need other imaging if not improving as expected  Keep splint on/ Ibuprofen as needed 400-600 mg 4 times per day   If finger is not better or if it gets worse, I would recommend follow-up with one of our sports medicine specialists (Dr Denyse Amassorey or Dr. Cherylann Parrhekkekandam aka Dr. Karie Schwalbe) for further evaluation in 2-4 weeks. Just call our office and ask for an appointment for sports medicine!

## 2017-12-10 ENCOUNTER — Ambulatory Visit: Payer: BLUE CROSS/BLUE SHIELD | Admitting: Osteopathic Medicine

## 2017-12-17 ENCOUNTER — Encounter: Payer: Self-pay | Admitting: Osteopathic Medicine

## 2017-12-17 ENCOUNTER — Ambulatory Visit: Payer: BLUE CROSS/BLUE SHIELD | Admitting: Osteopathic Medicine

## 2017-12-17 VITALS — BP 122/70 | HR 79 | Temp 98.0°F | Wt 155.0 lb

## 2017-12-17 DIAGNOSIS — Z30011 Encounter for initial prescription of contraceptive pills: Secondary | ICD-10-CM | POA: Diagnosis not present

## 2017-12-17 DIAGNOSIS — F419 Anxiety disorder, unspecified: Secondary | ICD-10-CM

## 2017-12-17 DIAGNOSIS — F411 Generalized anxiety disorder: Secondary | ICD-10-CM

## 2017-12-17 MED ORDER — NORGESTIM-ETH ESTRAD TRIPHASIC 0.18/0.215/0.25 MG-25 MCG PO TABS: 1.0000 | ORAL_TABLET | Freq: Every day | ORAL | 3 refills | Status: DC

## 2017-12-17 MED ORDER — ALPRAZOLAM 0.5 MG PO TABS: 0.2500 mg | ORAL_TABLET | Freq: Two times a day (BID) | ORAL | 0 refills | Status: DC | PRN

## 2017-12-17 MED ORDER — ESCITALOPRAM OXALATE 20 MG PO TABS: 20.0000 mg | ORAL_TABLET | Freq: Every day | ORAL | 3 refills | Status: DC

## 2017-12-17 NOTE — Progress Notes (Signed)
HPI: Anna FriezeSarah R Hines is a 37 y.o. female  who [facility], 12/17/17,  for chief complaint of: f/u anxiety, refills    Hx anxiety - last seen 08/11/17 - at that time, she stopped Zoloft previous year - had gotten out of a bad marriage and felt it was no longer needed. Had restarted Zoloft sometime in the interim then came off of it again. Did not tolerate buspirone. Never been on any other anti-anxiety medications. Zoloft had some side effects, she just doesn't feel right on this medication. Reported recent significant panic episode while driving. We started Lexapro 10 mg dose for maintenance, alprazolam for anti-anxiety as needed for severe panic symptoms. Doing well with sparing use Alprazolam, would like to try increasing Lexapro - moods a lot better overall but there is room for improvement.      Past medical history, surgical history, social history and family history reviewed.  Patient Active Problem List   Diagnosis Date Noted  . Anxiety 08/11/2017  . Injury of left ring finger 08/11/2017  . Oral contraception initiation 09/03/2016  . Failure to attend appointment 05/23/2016  . Counseling on health promotion and disease prevention 04/18/2016  . History of hyperlipidemia 04/18/2016  . Generalized anxiety disorder 04/11/2016    Current medication list and allergy/intolerance information reviewed.   Current Outpatient Medications on File Prior to Visit  Medication Sig Dispense Refill  . albuterol (PROVENTIL HFA;VENTOLIN HFA) 108 (90 Base) MCG/ACT inhaler Inhale 1-2 puffs into the lungs every 6 (six) hours as needed for wheezing or shortness of breath. 1 Inhaler 0  . ALPRAZolam (XANAX) 0.5 MG tablet Take 0.5-1 tablets (0.25-0.5 mg total) by mouth 2 (two) times daily as needed for anxiety (Use sparingly to avoid tolerance/dependence). #15 for ninety days 15 tablet 0  . escitalopram (LEXAPRO) 10 MG tablet Take 1 tablet (10 mg total)  by mouth daily. 90 tablet 1  . Norgestimate-Ethinyl Estradiol Triphasic (ORTHO TRI-CYCLEN LO) 0.18/0.215/0.25 MG-25 MCG tab Take 1 tablet by mouth daily. 3 Package 3   No current facility-administered medications on file prior to visit.    No Known Allergies    Review of Systems:  Constitutional: No recent illness  Cardiac: No  chest pain, No  pressure, No palpitations  Respiratory:  No  shortness of breath.   Neurologic: No  weakness, No  Dizziness  Psychiatric: No  concerns with depression, +concerns with anxiety  Exam:  BP 122/70   Pulse 79   Temp 98 F (36.7 C) (Oral)   Wt 155 lb (70.3 kg)   LMP 12/14/2017   BMI 25.79 kg/m   Constitutional: VS see above. General Appearance: alert, well-developed, well-nourished, NAD  Respiratory: Normal respiratory effort. no wheeze, no rhonchi, no rales  Cardiovascular: S1/S2 normal, no murmur, no rub/gallop auscultated. RRR.   Musculoskeletal: Gait normal. Symmetric and independent movement of all extremities.   Neurological: Normal balance/coordination. No tremor.  Skin: warm, dry, intact.   Psychiatric: Normal judgment/insight. Normal mood and affect. Oriented x3.       ASSESSMENT/PLAN:   Generalized anxiety disorder - Will increase dose Lexapro and see how she does!  - Plan: ALPRAZolam (XANAX) 0.5 MG tablet, escitalopram (LEXAPRO) 20 MG tablet  Anxiety - Severe symptoms better on Lexapro SSRI with sparing benzo use  - Plan: ALPRAZolam (XANAX) 0.5 MG tablet, escitalopram (LEXAPRO) 20 MG tablet  Oral contraception initiation - Plan: Norgestimate-Ethinyl Estradiol Triphasic (ORTHO TRI-CYCLEN LO) 0.18/0.215/0.25 MG-25 MCG tab  Follow-up plan: Return in about 6 months (around 06/16/2018) for recheck / refills for anxiety, sooner if needed .  Visit summary with medication list and pertinent instructions was printed for patient to review, alert [place] if any changes needed. All questions at time of visit were answered -  patient instructed to contact office with any additional concerns. ER/RTC precautions were reviewed with the patient and understanding verbalized.

## 2018-05-20 ENCOUNTER — Encounter: Payer: Self-pay | Admitting: Osteopathic Medicine

## 2018-05-20 ENCOUNTER — Ambulatory Visit (INDEPENDENT_AMBULATORY_CARE_PROVIDER_SITE_OTHER): Payer: BLUE CROSS/BLUE SHIELD | Admitting: Osteopathic Medicine

## 2018-05-20 VITALS — BP 101/72 | HR 78 | Temp 98.1°F | Wt 159.9 lb

## 2018-05-20 DIAGNOSIS — Z3490 Encounter for supervision of normal pregnancy, unspecified, unspecified trimester: Secondary | ICD-10-CM

## 2018-05-20 DIAGNOSIS — Z3201 Encounter for pregnancy test, result positive: Secondary | ICD-10-CM | POA: Diagnosis not present

## 2018-05-20 LAB — POCT URINE PREGNANCY: Preg Test, Ur: POSITIVE — AB

## 2018-05-20 NOTE — Progress Notes (Signed)
HPI: Anna Hines is a 37 y.o. female who  has a past medical history of Hyperlipidemia.  she presents to [facility] today, 05/20/18,  for chief complaint of:  Possible pregnancy Medication concern  Took 3 home pregnancy tests which were positive Concern about her current medicines Stopped birth control Taking about 1/4 tablet of the Lexapro Has not been on Xanax Has cut back on smoking from 1 ppd to 3 cigarettes per day  LMP 04/10/18 - certain about dates About 80% sure on keeping the pregnancy but has some questions about her options    Past medical history, surgical history, and family history reviewed.  Current medication list and allergy/intolerance information reviewed.   (See remainder of HPI, ROS, Phys Exam below)    Results for orders placed or performed in visit on 05/20/18 (from the past 72 hour(s))  POCT urine pregnancy     Status: Abnormal   Collection Time: 05/20/18 10:06 AM  Result Value Ref Range   Preg Test, Ur Positive (A) Negative     ASSESSMENT/PLAN: Discussed the sick plan for prenatal care, will confirm with blood test and if this corresponds to LMP can confirm due date at that time.  Patient will think about options, she was educated on options for EAB (answered questions about surgical and medical procedures) versus continuation of pregnancy, risks to early pregnancy given recent alcohol consumption, medications, smoking.  Discussed risks versus benefits of staying on SSRI for anxiety management, patient okay to cut down on dose for now.  Early stage of pregnancy - Plan: ABO AND RH , B-HCG Quant  Positive urine pregnancy test - Plan: POCT urine pregnancy, ABO AND RH , B-HCG Quant     Patient Instructions  Center for [facility] here in the [facility] [phone]  Stop Xaxax, birth control  OK to continue Lexapro at 1/2 tablet      Follow-up plan: Return for recheck depending on symptoms  .     ############################################ ############################################ ############################################ ############################################    Outpatient Encounter Medications as of 05/20/2018  Medication Sig  . albuterol (PROVENTIL HFA;VENTOLIN HFA) 108 (90 Base) MCG/ACT inhaler Inhale 1-2 puffs into the lungs every 6 (six) hours as needed for wheezing or shortness of breath.  . ALPRAZolam (XANAX) 0.5 MG tablet Take 0.5-1 tablets (0.25-0.5 mg total) by mouth 2 (two) times daily as needed for anxiety. #30 for 180 days  . escitalopram (LEXAPRO) 20 MG tablet Take 1 tablet (20 mg total) by mouth daily.  . Norgestimate-Ethinyl Estradiol Triphasic (ORTHO TRI-CYCLEN LO) 0.18/0.215/0.25 MG-25 MCG tab Take 1 tablet by mouth daily.   No facility-administered encounter medications on file as of 05/20/2018.    No Known Allergies    Review of Systems:  Constitutional: No recent illness  Cardiac: No  chest pain, No  pressure, No palpitations  Respiratory:  No  shortness of breath.   GU: Mild cramping, no unusual vaginal discharge or bleeding  Neurologic: No  weakness, No  Dizziness  Psychiatric: No  concerns with depression, +concerns with anxiety  Exam:  BP 101/72 (BP Location: Left Arm, Patient Position: Sitting, Cuff Size: Normal)   Pulse 78   Temp 98.1 F (36.7 C) (Oral)   Wt 159 lb 14.4 oz (72.5 kg)   BMI 26.61 kg/m   Constitutional: VS see above. General Appearance: alert, well-developed, well-nourished, NAD  Eyes: Normal lids and conjunctive, non-icteric sclera  Ears, Nose, Mouth, Throat: MMM, Normal external inspection ears/nares/mouth/lips/gums.  Neck: No  masses, trachea midline.   Respiratory: Normal respiratory effort.   Musculoskeletal: Gait normal. Symmetric and independent movement of all extremities  Neurological: Normal balance/coordination. No tremor.  Skin: warm, dry, intact.   Psychiatric: Normal  judgment/insight. Normal mood and affect. Oriented x3.   Visit summary with medication list and pertinent instructions was printed for patient to review, advised to alert us if any changes needed. All questions at time of visit were answered - patient instructed to contact office with any additional concerns. ER/RTC precautions were reviewed with the patient and understanding verbalized.   Follow-up plan: Return for recheck depending on symptoms .  Note: Total time spent 25 minutes, greater than 50% of the visit was spent face-to-face counseling and coordinating care for the following: The primary encounter diagnosis was Early stage of pregnancy. A diagnosis of Positive urine pregnancy test was also pertinent to this visit.[name].  Please note: voice recognition software was used to produce this document, and typos may escape review. Please contact Dr. Lyn HollingsheadAlexander for any needed clarifications.

## 2018-05-20 NOTE — Patient Instructions (Addendum)
[facility] [phone]  Stop Xaxax, birth control  OK to continue Lexapro at 1/2 tablet

## 2018-05-21 LAB — ABO AND RH

## 2018-05-21 LAB — HCG, QUANTITATIVE, PREGNANCY: HCG, Total, QN: 3251 m[IU]/mL

## 2018-06-16 ENCOUNTER — Encounter: Payer: Self-pay | Admitting: Obstetrics & Gynecology

## 2018-06-16 ENCOUNTER — Ambulatory Visit (INDEPENDENT_AMBULATORY_CARE_PROVIDER_SITE_OTHER): Payer: BLUE CROSS/BLUE SHIELD | Admitting: Obstetrics & Gynecology

## 2018-06-16 VITALS — BP 108/66 | HR 92 | Wt 162.0 lb

## 2018-06-16 DIAGNOSIS — Z3401 Encounter for supervision of normal first pregnancy, first trimester: Secondary | ICD-10-CM | POA: Diagnosis not present

## 2018-06-16 DIAGNOSIS — Z113 Encounter for screening for infections with a predominantly sexual mode of transmission: Secondary | ICD-10-CM | POA: Diagnosis not present

## 2018-06-16 DIAGNOSIS — Z3400 Encounter for supervision of normal first pregnancy, unspecified trimester: Secondary | ICD-10-CM

## 2018-06-16 DIAGNOSIS — F439 Reaction to severe stress, unspecified: Secondary | ICD-10-CM

## 2018-06-16 DIAGNOSIS — Z23 Encounter for immunization: Secondary | ICD-10-CM | POA: Diagnosis not present

## 2018-06-16 DIAGNOSIS — Z3480 Encounter for supervision of other normal pregnancy, unspecified trimester: Secondary | ICD-10-CM | POA: Insufficient documentation

## 2018-06-16 NOTE — Progress Notes (Signed)
Pt states pap smear was 2 years ago and it was normal

## 2018-06-16 NOTE — Progress Notes (Signed)
Single IUP with FHT of 171 BPM and CRL is 23.658mm  GA 7558w1d

## 2018-06-16 NOTE — Progress Notes (Signed)
  Subjective:    Anna FriezeSarah R Busser is being seen today for her first obstetrical visit.  This is a planned pregnancy. She is at 7557w4d gestation. Her obstetrical history is significant for advanced maternal age. Relationship with FOB: spouse, living together. Patient does intend to breast feed. Pregnancy history fully reviewed.  Patient reports no complaints except for a lot of stress. She weaned herself off of lexapro last week and she is having stress.   Review of Systems:   Review of Systems  Objective:     BP 108/66   Pulse 92   Wt 162 lb (73.5 kg)   LMP 04/10/2018   BMI 26.96 kg/m  Physical Exam  Exam    Assessment:    Pregnancy: G1P0 Patient Active Problem List   Diagnosis Date Noted  . Supervision of normal first pregnancy, antepartum 06/16/2018  . Early stage of pregnancy 05/20/2018  . Anxiety 08/11/2017  . Injury of left ring finger 08/11/2017  . Oral contraception initiation 09/03/2016  . Failure to attend appointment 05/23/2016  . Counseling on health promotion and disease prevention 04/18/2016  . History of hyperlipidemia 04/18/2016  . Generalized anxiety disorder 04/11/2016       Plan:     Initial labs drawn. Prenatal vitamins. Problem list reviewed and updated. AFP3 discussed: declined. Plans NIPS and MSAFP. Role of ultrasound in pregnancy discussed; fetal survey: requested. Amniocentesis discussed: not indicated. Follow up in 3 weeks. Flu vaccine today Plans opx Baby scripts Discussed rec'd weight gain  She will see Asher MuirJamie for stress management.  Allie BossierMyra C Burk Hoctor 06/16/2018

## 2018-06-17 LAB — OBSTETRIC PANEL
Antibody Screen: NOT DETECTED
BASOS ABS: 91 {cells}/uL (ref 0–200)
Basophils Relative: 0.8 %
EOS ABS: 616 {cells}/uL — AB (ref 15–500)
Eosinophils Relative: 5.4 %
HCT: 41.4 % (ref 35.0–45.0)
HEMOGLOBIN: 14 g/dL (ref 11.7–15.5)
HEP B S AG: NONREACTIVE
LYMPHS ABS: 2462 {cells}/uL (ref 850–3900)
MCH: 31.3 pg (ref 27.0–33.0)
MCHC: 33.8 g/dL (ref 32.0–36.0)
MCV: 92.4 fL (ref 80.0–100.0)
MPV: 11.6 fL (ref 7.5–12.5)
Monocytes Relative: 8.2 %
NEUTROS PCT: 64 %
Neutro Abs: 7296 cells/uL (ref 1500–7800)
PLATELETS: 280 10*3/uL (ref 140–400)
RBC: 4.48 10*6/uL (ref 3.80–5.10)
RDW: 12.1 % (ref 11.0–15.0)
RPR: NONREACTIVE
RUBELLA: 0.94 {index} — AB
TOTAL LYMPHOCYTE: 21.6 %
WBC mixed population: 935 cells/uL (ref 200–950)
WBC: 11.4 10*3/uL — AB (ref 3.8–10.8)

## 2018-06-17 LAB — GC/CHLAMYDIA PROBE AMP (~~LOC~~) NOT AT ARMC
CHLAMYDIA, DNA PROBE: NEGATIVE
Neisseria Gonorrhea: NEGATIVE

## 2018-06-17 LAB — HIV ANTIBODY (ROUTINE TESTING W REFLEX): HIV: NONREACTIVE

## 2018-06-18 LAB — CULTURE, OB URINE

## 2018-06-18 LAB — URINE CULTURE, OB REFLEX

## 2018-06-24 ENCOUNTER — Encounter: Payer: BLUE CROSS/BLUE SHIELD | Admitting: Obstetrics & Gynecology

## 2018-07-08 ENCOUNTER — Telehealth: Payer: Self-pay

## 2018-07-08 ENCOUNTER — Encounter: Payer: Self-pay | Admitting: Obstetrics & Gynecology

## 2018-07-08 ENCOUNTER — Ambulatory Visit (INDEPENDENT_AMBULATORY_CARE_PROVIDER_SITE_OTHER): Payer: BLUE CROSS/BLUE SHIELD | Admitting: Obstetrics & Gynecology

## 2018-07-08 VITALS — BP 102/66 | HR 108 | Wt 165.0 lb

## 2018-07-08 DIAGNOSIS — J302 Other seasonal allergic rhinitis: Secondary | ICD-10-CM

## 2018-07-08 DIAGNOSIS — Z3400 Encounter for supervision of normal first pregnancy, unspecified trimester: Secondary | ICD-10-CM

## 2018-07-08 DIAGNOSIS — E860 Dehydration: Secondary | ICD-10-CM

## 2018-07-08 DIAGNOSIS — Z3480 Encounter for supervision of other normal pregnancy, unspecified trimester: Secondary | ICD-10-CM | POA: Diagnosis not present

## 2018-07-08 MED ORDER — CETIRIZINE-PSEUDOEPHEDRINE ER 5-120 MG PO TB12: 1.0000 | ORAL_TABLET | Freq: Two times a day (BID) | ORAL | 5 refills | Status: DC

## 2018-07-08 NOTE — Progress Notes (Signed)
BP lying down: 106/73 BP sitting: 112/79 BP standing: 104/75 Not orthostatic

## 2018-07-08 NOTE — Progress Notes (Signed)
Pt c/o feeling dizzy    PRENATAL VISIT NOTE  Subjective:  Anna Hines is a 37 y.o. G1P0 at 578w5d being seen today for ongoing prenatal care.  She is currently monitored for the following issues for this low-risk pregnancy and has Generalized anxiety disorder; Counseling on health promotion and disease prevention; History of hyperlipidemia; Failure to attend appointment; Anxiety; Early stage of pregnancy; and Supervision of normal first pregnancy, antepartum on their problem list.  Patient reports dizziness when getting up (sitting to standing).  Contractions: Not present. Vag. Bleeding: None.  Movement: Absent. Denies leaking of fluid.   The following portions of the patient's history were reviewed and updated as appropriate: allergies, current medications, past family history, past medical history, past social history, past surgical history and problem list. Problem list updated.  Objective:   Vitals:   07/08/18 1026  BP: 102/66  Pulse: (!) 108  Weight: 165 lb (74.8 kg)    Fetal Status: Fetal Heart Rate (bpm): 156   Movement: Absent     General:  Alert, oriented and cooperative. Patient is in no acute distress.  Skin: Skin is warm and dry. No rash noted.   Cardiovascular: Normal heart rate noted  Respiratory: Normal respiratory effort, no problems with respiration noted  Abdomen: Soft, gravid, appropriate for gestational age.  Pain/Pressure: Absent     Pelvic: Cervical exam deferred        Extremities: Normal range of motion.  Edema: None  Mental Status: Normal mood and affect. Normal behavior. Normal judgment and thought content.   Assessment and Plan:  Pregnancy: G1P0 at 508w5d  1. Supervision of normal first pregnancy, antepartum --NIPS today and AFP at 16 weeks --Anatomy US --[facility] scripts not flowing.  Will make sure it is hooked up nad working. RN aware  2. Dehydration Increased specific gravity today--increase H20 intake.  3. Seasonal allergies TM clear;  Allegra D  Preterm labor symptoms and general obstetric precautions including but not limited to vaginal bleeding, contractions, leaking of fluid and fetal movement were reviewed in detail with the patient. Please refer to After Visit Summary for other counseling recommendations.  Return in about 7 weeks (around 08/26/2018).  RTC 20 weeks  Elsie LincolnKelly Catie Chiao, MD

## 2018-07-08 NOTE — Telephone Encounter (Signed)
Pt was here for appt today and told Dr.Leggett that her [employer] was not working. Dr.Leggett asked me to check with [employer]. I spoke with Justina at [employer] and she said that they have looked on their end and nothing is wrong. They suspect it is user error. They are following up with pt and will then follow up with us. [employer] asked for us to make notation of this conversation.

## 2018-07-15 ENCOUNTER — Telehealth: Payer: Self-pay

## 2018-07-15 NOTE — Telephone Encounter (Signed)
Pt returned my call about [employer] results and is aware that it is negative and predicted fetal sex is female. Pt requested a copy be mailed to her home.

## 2018-07-15 NOTE — Telephone Encounter (Signed)
Left message on pt's phone letting her know we got [employer] results back and to call the office to find out results.

## 2018-08-05 ENCOUNTER — Ambulatory Visit: Payer: BLUE CROSS/BLUE SHIELD

## 2018-08-05 DIAGNOSIS — Z3400 Encounter for supervision of normal first pregnancy, unspecified trimester: Secondary | ICD-10-CM | POA: Diagnosis not present

## 2018-08-05 NOTE — Progress Notes (Signed)
Pt here for AFP blood draw. PT has next OB appt for 09/01/18.

## 2018-08-06 LAB — ALPHA FETOPROTEIN, MATERNAL
AFP MOM: 1.05
AFP, Serum: 35.3 ng/mL
Calc'd Gestational Age: 16.7 weeks
MATERNAL WT: 165 [lb_av]
Risk for ONTD: <1
TWINS-AFP: 1

## 2018-08-20 ENCOUNTER — Encounter (HOSPITAL_COMMUNITY): Payer: Self-pay

## 2018-08-27 ENCOUNTER — Other Ambulatory Visit: Payer: Self-pay | Admitting: Obstetrics & Gynecology

## 2018-08-27 ENCOUNTER — Ambulatory Visit (HOSPITAL_COMMUNITY)
Admission: RE | Admit: 2018-08-27 | Discharge: 2018-08-27 | Disposition: A | Discharge status: Home / Self Care | Payer: BLUE CROSS/BLUE SHIELD | Source: Ambulatory Visit | Attending: Obstetrics & Gynecology | Admitting: Obstetrics & Gynecology

## 2018-08-27 DIAGNOSIS — Z363 Encounter for antenatal screening for malformations: Secondary | ICD-10-CM | POA: Diagnosis not present

## 2018-08-27 DIAGNOSIS — Z3400 Encounter for supervision of normal first pregnancy, unspecified trimester: Secondary | ICD-10-CM

## 2018-08-27 DIAGNOSIS — Z3A19 19 weeks gestation of pregnancy: Secondary | ICD-10-CM | POA: Insufficient documentation

## 2018-08-27 DIAGNOSIS — Z3482 Encounter for supervision of other normal pregnancy, second trimester: Secondary | ICD-10-CM | POA: Insufficient documentation

## 2018-08-27 DIAGNOSIS — O09512 Supervision of elderly primigravida, second trimester: Secondary | ICD-10-CM | POA: Diagnosis not present

## 2018-09-01 ENCOUNTER — Encounter: Payer: Self-pay | Admitting: Certified Nurse Midwife

## 2018-09-01 ENCOUNTER — Ambulatory Visit (INDEPENDENT_AMBULATORY_CARE_PROVIDER_SITE_OTHER): Payer: BLUE CROSS/BLUE SHIELD | Admitting: Certified Nurse Midwife

## 2018-09-01 VITALS — BP 117/72 | HR 87 | Wt 177.0 lb

## 2018-09-01 DIAGNOSIS — Z3402 Encounter for supervision of normal first pregnancy, second trimester: Secondary | ICD-10-CM

## 2018-09-01 DIAGNOSIS — Z3400 Encounter for supervision of normal first pregnancy, unspecified trimester: Secondary | ICD-10-CM

## 2018-09-01 DIAGNOSIS — O2600 Excessive weight gain in pregnancy, unspecified trimester: Secondary | ICD-10-CM | POA: Insufficient documentation

## 2018-09-01 DIAGNOSIS — O2602 Excessive weight gain in pregnancy, second trimester: Secondary | ICD-10-CM

## 2018-09-01 NOTE — Progress Notes (Signed)
   PRENATAL VISIT NOTE  Subjective:  Anna Hines is a 37 y.o. G1P0 at 1046w4d being seen today for ongoing prenatal care.  She is currently monitored for the following issues for this low-risk pregnancy and has Generalized anxiety disorder; Counseling on health promotion and disease prevention; History of hyperlipidemia; Failure to attend appointment; Anxiety; Early stage of pregnancy; Supervision of normal first pregnancy, antepartum; and Excessive weight gain during pregnancy on their problem list.  Patient reports no complaints.  Contractions: Not present. Vag. Bleeding: None.  Movement: Present. Denies leaking of fluid.   The following portions of the patient's history were reviewed and updated as appropriate: allergies, current medications, past family history, past medical history, past social history, past surgical history and problem list. Problem list updated.  Objective:   Vitals:   09/01/18 0815  BP: 117/72  Pulse: 87  Weight: 177 lb (80.3 kg)    Fetal Status: Fetal Heart Rate (bpm): 153 Fundal Height: 20 cm Movement: Present     General:  Alert, oriented and cooperative. Patient is in no acute distress.  Skin: Skin is warm and dry. No rash noted.   Cardiovascular: Normal heart rate noted  Respiratory: Normal respiratory effort, no problems with respiration noted  Abdomen: Soft, gravid, appropriate for gestational age.  Pain/Pressure: Absent     Pelvic: Cervical exam deferred        Extremities: Normal range of motion.  Edema: None  Mental Status: Normal mood and affect. Normal behavior. Normal judgment and thought content.   Assessment and Plan:  Pregnancy: G1P0 at 2746w4d  1. Supervision of normal first pregnancy, antepartum - Patient doing well no complaints  - Currently on babyscripts optimized schedule  - BP stable  - US on 11/7 showed fetal bowel slightly hyperechogenic and possible hematoma. Patient aware of findings and reports no f/u needed right now due to  possibility of intraamniotic bleeding that can cause hyperechogenic bowel on US.  - MFM recommends to US f/u as clinically indicated and did not schedule f/u appointment from findings.   2. Excessive weight gain during pregnancy in second trimester - patient has gained 22lbs during this pregnancy - reports eating a lot of sweets throughout day, educated on nutrition changes and weight gain recommendations during pregnancy, will continue to closely monitor weight gain.   Preterm labor symptoms and general obstetric precautions including but not limited to vaginal bleeding, contractions, leaking of fluid and fetal movement were reviewed in detail with the patient. Please refer to After Visit Summary for other counseling recommendations.  Return in about 8 weeks (around 10/27/2018) for ROB/2hrGTT.  Future Appointments  Date Time Provider Department Center  10/30/2018  8:15 AM Donette LarryBhambri, Melanie, CNM [facility]    Sharyon CableVeronica C Natelie Ostrosky, CNM

## 2018-09-01 NOTE — Progress Notes (Signed)
duplicate

## 2018-09-01 NOTE — Patient Instructions (Signed)
Preventing Unhealthy Weight Gain, Adult Staying at a healthy weight is important. When fat builds up in your body, you may become overweight or obese. These conditions put you at greater risk for developing certain health problems, such as heart disease, diabetes, sleeping problems, joint problems, and some cancers. Unhealthy weight gain is often the result of making unhealthy choices in what you eat. It is also a result of not getting enough exercise. You can make changes to your lifestyle to prevent obesity and stay as healthy as possible. What nutrition changes can be made? To maintain a healthy weight and prevent obesity:  Eat only as much as your body needs. To do this: ? Pay attention to signs that you are hungry or full. Stop eating as soon as you feel full. ? If you feel hungry, try drinking water first. Drink enough water so your urine is clear or pale yellow. ? Eat smaller portions. ? Look at serving sizes on food labels. Most foods contain more than one serving per container. ? Eat the recommended amount of calories for your gender and activity level. While most active people should eat around 2,000 calories per day, if you are trying to lose weight or are not very active, you main need to eat less calories. Talk to your health care provider or dietitian about how many calories you should eat each day.  Choose healthy foods, such as: ? Fruits and vegetables. Try to fill at least half of your plate at each meal with fruits and vegetables. ? Whole grains, such as whole wheat bread, brown rice, and quinoa. ? Lean meats, such as chicken or fish. ? Other healthy proteins, such as beans, eggs, or tofu. ? Healthy fats, such as nuts, seeds, fatty fish, and olive oil. ? Low-fat or fat-free dairy.  Check food labels and avoid food and drinks that: ? Are high in calories. ? Have added sugar. ? Are high in sodium. ? Have saturated fats or trans fats.  Limit how much you eat of the following  foods: ? Prepackaged meals. ? Fast food. ? Fried foods. ? Processed meat, such as bacon, sausage, and deli meats. ? Fatty cuts of red meat and poultry with skin.  Cook foods in healthier ways, such as by baking, broiling, or grilling.  When grocery shopping, try to shop around the outside of the store. This helps you buy mostly fresh foods and avoid canned and prepackaged foods.  What lifestyle changes can be made?  Exercise at least 30 minutes 5 or more days each week. Exercising includes brisk walking, yard work, biking, running, swimming, and team sports like basketball and soccer. Ask your health care provider which exercises are safe for you.

## 2018-10-21 NOTE — L&D Delivery Note (Addendum)
Delivery Note At 10:24 AM a viable female was delivered via Vaginal, Spontaneous (Presentation: cephalic; LOA ).  APGAR: 9, 9; weight 6 lb 12.6 oz (3080 g).   Placenta status:Spontaneous ,Complete .  Cord: 3 vessel  with the following complications: PPH and Second degree vaginal laceration repair .  Cord QZ:RAQT   Patient delivered NSVD over inact perineum and Pitocin was started prior to delivery of the placenta. Uterus was still boggy after 3rd stage of labour with some brisk vaginal bleeding and of cytotec was administered PR, IV TXA and Methergine 0.2mg  was administered x1. Lower uterine segment was swept digitally and clots evacuated, Placenta was evaluated again as complete. Uterus began to firm up after cytotec and vulva and vagina was inspected for lacerations, having a 2nd degree that was repaired with 3.0 vicryl in the standard fashion. Vulva and vagina was inspected for hemostasis post repair and uterus was.   Anesthesia: Epidural  Episiotomy: None Lacerations: 2nd degree;Perineal Suture Repair: 3.0 vicryl Est. Blood Loss (mL): 1094  Mom to postpartum.  Baby to Couplet care / Skin to Skin.  [name] MD Family Medicine Resident [facility] [place]  12/28/2018, 12:00 PM  OB FELLOW DELIVERY ATTESTATION  I was gloved and present for the delivery in its entirety, and I agree with the above resident's note.    Gwenevere Abbot, MD  OB Fellow  12/28/2018, 1:52 PM

## 2018-10-30 ENCOUNTER — Ambulatory Visit (INDEPENDENT_AMBULATORY_CARE_PROVIDER_SITE_OTHER): Payer: BLUE CROSS/BLUE SHIELD | Admitting: Certified Nurse Midwife

## 2018-10-30 VITALS — BP 116/76 | HR 96 | Wt 185.0 lb

## 2018-10-30 DIAGNOSIS — Z3400 Encounter for supervision of normal first pregnancy, unspecified trimester: Secondary | ICD-10-CM

## 2018-10-30 DIAGNOSIS — Z3480 Encounter for supervision of other normal pregnancy, unspecified trimester: Secondary | ICD-10-CM | POA: Diagnosis not present

## 2018-10-30 DIAGNOSIS — Z23 Encounter for immunization: Secondary | ICD-10-CM | POA: Diagnosis not present

## 2018-10-30 DIAGNOSIS — Z3483 Encounter for supervision of other normal pregnancy, third trimester: Secondary | ICD-10-CM

## 2018-10-30 NOTE — Progress Notes (Signed)
Subjective:  Anna Hines is a 38 y.o. G1P0 at [redacted]w[redacted]d being seen today for ongoing prenatal care.  She is currently monitored for the following issues for this low-risk pregnancy and has Generalized anxiety disorder; Counseling on health promotion and disease prevention; History of hyperlipidemia; Failure to attend appointment; Anxiety; Supervision of normal first pregnancy, antepartum; and Excessive weight gain during pregnancy on their problem list.  Patient reports itching of back of legs and back. No new detergents or skin products. No rash. Contractions: Not present. Vag. Bleeding: None.  Movement: Present. Denies leaking of fluid.   The following portions of the patient's history were reviewed and updated as appropriate: allergies, current medications, past family history, past medical history, past social history, past surgical history and problem list. Problem list updated.  Objective:   Vitals:   10/30/18 0822  BP: 116/76  Pulse: 96  Weight: 83.9 kg    Fetal Status: Fetal Heart Rate (bpm): 144 Fundal Height: 29 cm Movement: Present     General:  Alert, oriented and cooperative. Patient is in no acute distress.  Skin: Skin is warm and dry. No rash noted. Excoriation to left lower calf.   Cardiovascular: Normal heart rate noted  Respiratory: Normal respiratory effort, no problems with respiration noted  Abdomen: Soft, gravid, appropriate for gestational age. Pain/Pressure: Absent     Pelvic: Vag. Bleeding: None Vag D/C Character: Thin   Cervical exam deferred        Extremities: Normal range of motion.  Edema: Trace  Mental Status: Normal mood and affect. Normal behavior. Normal judgment and thought content.   Urinalysis:      Assessment and Plan:  Pregnancy: G1P0 at [redacted]w[redacted]d  1. Supervision of other normal pregnancy, antepartum - HIV antibody (with reflex) - CBC - RPR - 2Hr GTT w/ 1 Hr Carpenter 75 g - Tdap vaccine greater than or equal to 7yo IM  2. Itching -  unknown etiology - likely allergen - can use Eucerin lotion for itching, switch to fragrance free products, OTC Hydrocortisone cream prn  Preterm labor symptoms and general obstetric precautions including but not limited to vaginal bleeding, contractions, leaking of fluid and fetal movement were reviewed in detail with the patient. Please refer to After Visit Summary for other counseling recommendations.  Return in about 3 weeks (around 11/20/2018).   Donette Larry, CNM

## 2018-10-30 NOTE — Progress Notes (Signed)
Pt c/o itching on back and legs

## 2018-10-30 NOTE — Patient Instructions (Signed)

## 2018-11-02 LAB — 2HR GTT W 1 HR, CARPENTER, 75 G
Glucose, 1 Hr, Gest: 132 mg/dL (ref 65–179)
Glucose, 2 Hr, Gest: 88 mg/dL (ref 65–152)
Glucose, Fasting, Gest: 73 mg/dL (ref 65–91)

## 2018-11-02 LAB — CBC
HCT: 36 % (ref 35.0–45.0)
HEMOGLOBIN: 12.3 g/dL (ref 11.7–15.5)
MCH: 29.4 pg (ref 27.0–33.0)
MCHC: 34.2 g/dL (ref 32.0–36.0)
MCV: 85.9 fL (ref 80.0–100.0)
MPV: 11.9 fL (ref 7.5–12.5)
PLATELETS: 252 10*3/uL (ref 140–400)
RBC: 4.19 10*6/uL (ref 3.80–5.10)
RDW: 12.5 % (ref 11.0–15.0)
WBC: 10.3 10*3/uL (ref 3.8–10.8)

## 2018-11-02 LAB — HIV ANTIBODY (ROUTINE TESTING W REFLEX): HIV: NONREACTIVE

## 2018-11-02 LAB — RPR: RPR Ser Ql: NONREACTIVE

## 2018-11-20 ENCOUNTER — Ambulatory Visit (INDEPENDENT_AMBULATORY_CARE_PROVIDER_SITE_OTHER): Payer: BLUE CROSS/BLUE SHIELD

## 2018-11-20 VITALS — BP 128/78 | HR 103 | Wt 193.0 lb

## 2018-11-20 DIAGNOSIS — Z3480 Encounter for supervision of other normal pregnancy, unspecified trimester: Secondary | ICD-10-CM

## 2018-11-20 DIAGNOSIS — Z3483 Encounter for supervision of other normal pregnancy, third trimester: Secondary | ICD-10-CM

## 2018-11-20 NOTE — Progress Notes (Signed)
   PRENATAL VISIT NOTE  Subjective:  Anna Hines is a 38 y.o. G1P0 at [redacted]w[redacted]d being seen today for ongoing prenatal care.  She is currently monitored for the following issues for this low-risk pregnancy and has Generalized anxiety disorder; Counseling on health promotion and disease prevention; History of hyperlipidemia; Failure to attend appointment; Anxiety; Supervision of normal first pregnancy, antepartum; and Excessive weight gain during pregnancy on their problem list.  Patient reports no complaints.  Contractions: Not present. Vag. Bleeding: None.  Movement: Present. Denies leaking of fluid.   The following portions of the patient's history were reviewed and updated as appropriate: allergies, current medications, past family history, past medical history, past social history, past surgical history and problem list. Problem list updated.  Objective:   Vitals:   11/20/18 0905  BP: 128/78  Pulse: (!) 103  Weight: 193 lb (87.5 kg)    Fetal Status: Fetal Heart Rate (bpm): 153   Movement: Present     General:  Alert, oriented and cooperative. Patient is in no acute distress.  Skin: Skin is warm and dry. No rash noted.   Cardiovascular: Normal heart rate noted  Respiratory: Normal respiratory effort, no problems with respiration noted  Abdomen: Soft, gravid, appropriate for gestational age.  Pain/Pressure: Absent     Pelvic: Cervical exam deferred        Extremities: Normal range of motion.  Edema: Trace  Mental Status: Normal mood and affect. Normal behavior. Normal judgment and thought content.   Assessment and Plan:  Pregnancy: G1P0 at [redacted]w[redacted]d  1. Supervision of other normal pregnancy, antepartum -No complaints. Routine care. Anticipatory guidance for next visit and GBS reviewed  Preterm labor symptoms and general obstetric precautions including but not limited to vaginal bleeding, contractions, leaking of fluid and fetal movement were reviewed in detail with the  patient. Please refer to After Visit Summary for other counseling recommendations.  Return in about 4 weeks (around 12/18/2018) for Return OB visit and gbs.  Future Appointments  Date Time Provider Department Center  12/16/2018  8:15 AM Rasch, Harolyn Rutherford, NP [facility]    Rolm Bookbinder, [place]  11/20/18  9:26 AM

## 2018-11-20 NOTE — Progress Notes (Signed)
Pt c/o lower back pain

## 2018-11-20 NOTE — Patient Instructions (Signed)

## 2018-12-16 ENCOUNTER — Ambulatory Visit (INDEPENDENT_AMBULATORY_CARE_PROVIDER_SITE_OTHER): Payer: BLUE CROSS/BLUE SHIELD | Admitting: Obstetrics and Gynecology

## 2018-12-16 VITALS — BP 133/86 | HR 101 | Wt 198.0 lb

## 2018-12-16 DIAGNOSIS — Z3483 Encounter for supervision of other normal pregnancy, third trimester: Secondary | ICD-10-CM | POA: Diagnosis not present

## 2018-12-16 DIAGNOSIS — Z113 Encounter for screening for infections with a predominantly sexual mode of transmission: Secondary | ICD-10-CM | POA: Diagnosis not present

## 2018-12-16 DIAGNOSIS — Z3A35 35 weeks gestation of pregnancy: Secondary | ICD-10-CM | POA: Diagnosis not present

## 2018-12-16 DIAGNOSIS — L299 Pruritus, unspecified: Secondary | ICD-10-CM | POA: Diagnosis not present

## 2018-12-16 DIAGNOSIS — Z3480 Encounter for supervision of other normal pregnancy, unspecified trimester: Secondary | ICD-10-CM

## 2018-12-16 LAB — OB RESULTS CONSOLE GBS: GBS: NEGATIVE

## 2018-12-16 MED ORDER — HYDROXYZINE PAMOATE 25 MG PO CAPS: 25.0000 mg | ORAL_CAPSULE | Freq: Three times a day (TID) | ORAL | 0 refills | Status: DC | PRN

## 2018-12-16 NOTE — Progress Notes (Signed)
   PRENATAL VISIT NOTE  Subjective:  Anna Hines is a 38 y.o. G1P0 at [redacted]w[redacted]d being seen today for ongoing prenatal care.  She is currently monitored for the following issues for this low-risk pregnancy and has Generalized anxiety disorder; Counseling on health promotion and disease prevention; History of hyperlipidemia; Failure to attend appointment; Anxiety; Supervision of other normal pregnancy, antepartum; Excessive weight gain during pregnancy; and Itching on their problem list.  Patient reports itching all over her body for a few weeks.  Contractions: Not present. Vag. Bleeding: None.  Movement: Present. Denies leaking of fluid.   The following portions of the patient's history were reviewed and updated as appropriate: allergies, current medications, past family history, past medical history, past social history, past surgical history and problem list. Problem list updated.  Objective:   Vitals:   12/16/18 0812  BP: 133/86  Pulse: (!) 101  Weight: 198 lb (89.8 kg)    Fetal Status: Fetal Heart Rate (bpm): 138 Fundal Height: 36 cm Movement: Present     General:  Alert, oriented and cooperative. Patient is in no acute distress.  Skin: Skin is warm and dry. No rash noted.   Cardiovascular: Normal heart rate noted  Respiratory: Normal respiratory effort, no problems with respiration noted  Abdomen: Soft, gravid, appropriate for gestational age.  Pain/Pressure: Present     Pelvic: Cervical exam performed Dilation: Closed Effacement (%): Thick Station: Ballotable, -3  Extremities: Normal range of motion.  Edema: Trace  Mental Status: Normal mood and affect. Normal behavior. Normal judgment and thought content.   Assessment and Plan:  Pregnancy: G1P0 at [redacted]w[redacted]d  1. Supervision of other normal pregnancy, antepartum  - Culture, beta strep (group b only) - Cervicovaginal ancillary only( [place]) - Bile acids, total  2. Itching  - Bile acids, total - If elevated will start  antenatal testing with delivery at 37 weeks - Rx Vistaril  - Peds list given today   Preterm labor symptoms and general obstetric precautions including but not limited to vaginal bleeding, contractions, leaking of fluid and fetal movement were reviewed in detail with the patient. Please refer to After Visit Summary for other counseling recommendations.  Return in about 1 week (around 12/23/2018).  No future appointments.  Venia Carbon, NP

## 2018-12-17 LAB — CERVICOVAGINAL ANCILLARY ONLY
Chlamydia: NEGATIVE
Neisseria Gonorrhea: NEGATIVE

## 2018-12-18 LAB — BILE ACIDS, TOTAL: BILE ACIDS TOTAL: 11 umol/L (ref 0–19)

## 2018-12-19 LAB — CULTURE, BETA STREP (GROUP B ONLY)
MICRO NUMBER:: 245159
SPECIMEN QUALITY:: ADEQUATE

## 2018-12-21 ENCOUNTER — Encounter: Payer: Self-pay | Admitting: Obstetrics and Gynecology

## 2018-12-21 ENCOUNTER — Encounter: Payer: Self-pay | Admitting: *Deleted

## 2018-12-21 ENCOUNTER — Telehealth: Payer: Self-pay | Admitting: *Deleted

## 2018-12-21 ENCOUNTER — Telehealth: Payer: Self-pay | Admitting: Obstetrics and Gynecology

## 2018-12-21 ENCOUNTER — Other Ambulatory Visit: Payer: Self-pay | Admitting: Family Medicine

## 2018-12-21 DIAGNOSIS — O26619 Liver and biliary tract disorders in pregnancy, unspecified trimester: Secondary | ICD-10-CM

## 2018-12-21 DIAGNOSIS — O26649 Intrahepatic cholestasis of pregnancy, unspecified trimester: Secondary | ICD-10-CM | POA: Insufficient documentation

## 2018-12-21 DIAGNOSIS — K831 Obstruction of bile duct: Secondary | ICD-10-CM | POA: Insufficient documentation

## 2018-12-21 MED ORDER — URSODIOL 300 MG PO CAPS: 300.0000 mg | ORAL_CAPSULE | Freq: Two times a day (BID) | ORAL | 0 refills | Status: DC

## 2018-12-21 NOTE — Addendum Note (Signed)
Addended by: Venia Carbon I on: 12/21/2018 10:10 AM   Modules accepted: Orders, SmartSet

## 2018-12-21 NOTE — Telephone Encounter (Signed)
Left patient a message to call and schedule appointments this week per Venia Carbon, NP.

## 2018-12-21 NOTE — Telephone Encounter (Signed)
Patient noticed of elevated bile acids: she is officially high risk with cholestasis.  NST will be scheduled this week and induction at the end of the week @ 37 weeks.  Rx: Actigall 300 mg PO BID    Venia Carbon I, NP 12/21/2018 10:23 AM

## 2018-12-22 ENCOUNTER — Ambulatory Visit (INDEPENDENT_AMBULATORY_CARE_PROVIDER_SITE_OTHER): Payer: BLUE CROSS/BLUE SHIELD | Admitting: Certified Nurse Midwife

## 2018-12-22 DIAGNOSIS — K831 Obstruction of bile duct: Secondary | ICD-10-CM

## 2018-12-22 DIAGNOSIS — O26613 Liver and biliary tract disorders in pregnancy, third trimester: Secondary | ICD-10-CM | POA: Diagnosis not present

## 2018-12-22 DIAGNOSIS — Z3A37 37 weeks gestation of pregnancy: Secondary | ICD-10-CM

## 2018-12-22 NOTE — Progress Notes (Signed)
Discussed with patient current position of fetus and not being able to induce labor in this position. Educated on version vs C/S. Patient verbalizes that she "wants to do whatever is best for baby and not cause too much distress".   Reviewed transverse position of fetus with Dr Adrian Blackwater. Patient is currently [redacted]w[redacted]d who is scheduled for IOL for cholestasis on 3/7. Dr Adrian Blackwater recommends version prior to IOL on Saturday.   Patient agrees to plans of care with attempting version on Saturday. Informed not to eat or drink anything after midnight and arrive to hospital at 7:45am on 3/7. Will attempt version and if successful continue with IOL. Message sent to Dr Shawnie Pons to notify of plan discussed with Dr Adrian Blackwater.   Sharyon Cable, CNM 12/22/18, 10:16 AM

## 2018-12-22 NOTE — Progress Notes (Signed)
Pt here for Antenatal testing before her IOL on Sat @ 37 weeks due to Cholestasis.  It is noted on U/S today that the baby is breech position.  Lanice Shirts, CNM is discussing case with Dr Adrian Blackwater for possible inversion.  AFI today was 16.36 cm and NST is reactive.

## 2018-12-23 ENCOUNTER — Telehealth (HOSPITAL_COMMUNITY): Payer: Self-pay | Admitting: *Deleted

## 2018-12-23 NOTE — Telephone Encounter (Signed)
Preadmission screen  

## 2018-12-24 ENCOUNTER — Telehealth (HOSPITAL_COMMUNITY): Payer: Self-pay | Admitting: *Deleted

## 2018-12-24 ENCOUNTER — Encounter (HOSPITAL_COMMUNITY): Payer: Self-pay | Admitting: *Deleted

## 2018-12-24 NOTE — Telephone Encounter (Signed)
Preadmission screen  

## 2018-12-25 ENCOUNTER — Ambulatory Visit (INDEPENDENT_AMBULATORY_CARE_PROVIDER_SITE_OTHER): Payer: BLUE CROSS/BLUE SHIELD | Admitting: Certified Nurse Midwife

## 2018-12-25 VITALS — BP 128/83 | HR 96 | Wt 200.0 lb

## 2018-12-25 DIAGNOSIS — O26643 Intrahepatic cholestasis of pregnancy, third trimester: Secondary | ICD-10-CM

## 2018-12-25 DIAGNOSIS — Z3480 Encounter for supervision of other normal pregnancy, unspecified trimester: Secondary | ICD-10-CM

## 2018-12-25 DIAGNOSIS — O26613 Liver and biliary tract disorders in pregnancy, third trimester: Secondary | ICD-10-CM

## 2018-12-25 DIAGNOSIS — Z3A37 37 weeks gestation of pregnancy: Secondary | ICD-10-CM

## 2018-12-25 DIAGNOSIS — O321XX0 Maternal care for breech presentation, not applicable or unspecified: Secondary | ICD-10-CM

## 2018-12-25 DIAGNOSIS — K831 Obstruction of bile duct: Secondary | ICD-10-CM

## 2018-12-25 NOTE — Progress Notes (Signed)
Baby is still breech.  Pt unsure whether she wants her version or go straight to C-Section

## 2018-12-25 NOTE — Progress Notes (Signed)
Subjective:  Anna Hines is a 38 y.o. G1P0 at [redacted]w[redacted]d being seen today for ongoing prenatal care.  She is currently monitored for the following issues for this high-risk pregnancy and has Generalized anxiety disorder; Counseling on health promotion and disease prevention; History of hyperlipidemia; Failure to attend appointment; Anxiety; Supervision of other normal pregnancy, antepartum; Excessive weight gain during pregnancy; Itching; and Cholestasis during pregnancy on their problem list.  Patient reports continues to have itching, has not picked up Actigall, has Vistaril at home but not using.  Contractions: Not present. Vag. Bleeding: None.  Movement: Present. Denies leaking of fluid.   Denies HA, visual disturbances, RUQ pain, SOB, and CP.  The following portions of the patient's history were reviewed and updated as appropriate: allergies, current medications, past family history, past medical history, past social history, past surgical history and problem list. Problem list updated.  Objective:   Vitals:   12/25/18 0856 12/25/18 0913  BP: (!) 142/92 128/83  Pulse: 96 96  Weight: 90.7 kg     Fetal Status: Fetal Heart Rate (bpm): 142 Fundal Height: 38 cm Movement: Present  Presentation: Anna Hines  General:  Alert, oriented and cooperative. Patient is in no acute distress.  Skin: Skin is warm and dry. No rash noted.   Cardiovascular: Normal heart rate noted  Respiratory: Normal respiratory effort, no problems with respiration noted  Abdomen: Soft, gravid, appropriate for gestational age. Pain/Pressure: Present     Pelvic: Vag. Bleeding: None Vag D/C Character: Thin   Cervical exam deferred        Extremities: Normal range of motion.  Edema: Trace  Mental Status: Normal mood and affect. Normal behavior. Normal judgment and thought content.   Urinalysis:      Assessment and Plan:  Pregnancy: G1P0 at 107w0d  1. Hines presentation -  Considering PCS vs ECV - discussed in  detail risks/benefits of both> risk of ECV failure or fetal intolerance but less risk with SVD and quicker recovery; risk of bleeding, infection, damage to surrounding organs, and longer recovery with CS - pt opts for ECV tomorrow then IOL   2. Cholestasis during pregnancy in third trimester - IOL if successful ECV - scheduled 3/7 @0800   3. Supervision of other normal pregnancy, antepartum   Term labor symptoms and general obstetric precautions including but not limited to vaginal bleeding, contractions, leaking of fluid and fetal movement were reviewed in detail with the patient. Please refer to After Visit Summary for other counseling recommendations.  Return in about 1 day (around 12/26/2018).   Donette Larry, CNM

## 2018-12-26 ENCOUNTER — Encounter (HOSPITAL_COMMUNITY): Payer: Self-pay | Admitting: General Practice

## 2018-12-26 ENCOUNTER — Inpatient Hospital Stay (HOSPITAL_COMMUNITY): Payer: BLUE CROSS/BLUE SHIELD

## 2018-12-26 ENCOUNTER — Other Ambulatory Visit: Payer: Self-pay

## 2018-12-26 ENCOUNTER — Inpatient Hospital Stay (HOSPITAL_COMMUNITY)
Admission: AD | Admit: 2018-12-26 | Discharge: 2018-12-30 | DRG: 805 | Disposition: A | Discharge status: Home / Self Care | Payer: BLUE CROSS/BLUE SHIELD | Attending: Obstetrics and Gynecology | Admitting: Obstetrics and Gynecology

## 2018-12-26 DIAGNOSIS — O321XX0 Maternal care for breech presentation, not applicable or unspecified: Principal | ICD-10-CM | POA: Diagnosis present

## 2018-12-26 DIAGNOSIS — O99344 Other mental disorders complicating childbirth: Secondary | ICD-10-CM | POA: Diagnosis present

## 2018-12-26 DIAGNOSIS — O134 Gestational [pregnancy-induced] hypertension without significant proteinuria, complicating childbirth: Secondary | ICD-10-CM | POA: Diagnosis not present

## 2018-12-26 DIAGNOSIS — Z3A37 37 weeks gestation of pregnancy: Secondary | ICD-10-CM

## 2018-12-26 DIAGNOSIS — O26619 Liver and biliary tract disorders in pregnancy, unspecified trimester: Secondary | ICD-10-CM

## 2018-12-26 DIAGNOSIS — Z87891 Personal history of nicotine dependence: Secondary | ICD-10-CM | POA: Diagnosis not present

## 2018-12-26 DIAGNOSIS — Z3490 Encounter for supervision of normal pregnancy, unspecified, unspecified trimester: Secondary | ICD-10-CM | POA: Diagnosis present

## 2018-12-26 DIAGNOSIS — O901 Disruption of perineal obstetric wound: Secondary | ICD-10-CM | POA: Diagnosis not present

## 2018-12-26 DIAGNOSIS — K831 Obstruction of bile duct: Secondary | ICD-10-CM | POA: Diagnosis present

## 2018-12-26 DIAGNOSIS — O26649 Intrahepatic cholestasis of pregnancy, unspecified trimester: Secondary | ICD-10-CM | POA: Diagnosis present

## 2018-12-26 DIAGNOSIS — F411 Generalized anxiety disorder: Secondary | ICD-10-CM | POA: Diagnosis not present

## 2018-12-26 DIAGNOSIS — O2662 Liver and biliary tract disorders in childbirth: Secondary | ICD-10-CM | POA: Diagnosis present

## 2018-12-26 DIAGNOSIS — O411230 Chorioamnionitis, third trimester, not applicable or unspecified: Secondary | ICD-10-CM | POA: Diagnosis not present

## 2018-12-26 DIAGNOSIS — O133 Gestational [pregnancy-induced] hypertension without significant proteinuria, third trimester: Secondary | ICD-10-CM | POA: Diagnosis present

## 2018-12-26 LAB — TYPE AND SCREEN
ABO/RH(D): A POS
Antibody Screen: NEGATIVE

## 2018-12-26 LAB — CBC
HCT: 35.9 % — ABNORMAL LOW (ref 36.0–46.0)
HCT: 35.1 % — ABNORMAL LOW (ref 36.0–46.0)
Hemoglobin: 11.4 g/dL — ABNORMAL LOW (ref 12.0–15.0)
Hemoglobin: 11 g/dL — ABNORMAL LOW (ref 12.0–15.0)
MCH: 26.6 pg (ref 26.0–34.0)
MCH: 26.3 pg (ref 26.0–34.0)
MCHC: 31.8 g/dL (ref 30.0–36.0)
MCHC: 31.3 g/dL (ref 30.0–36.0)
MCV: 83.9 fL (ref 80.0–100.0)
MCV: 83.8 fL (ref 80.0–100.0)
Platelets: 206 10*3/uL (ref 150–400)
Platelets: 205 10*3/uL (ref 150–400)
RBC: 4.28 MIL/uL (ref 3.87–5.11)
RBC: 4.19 MIL/uL (ref 3.87–5.11)
RDW: 13.4 % (ref 11.5–15.5)
RDW: 13.5 % (ref 11.5–15.5)
WBC: 10.1 10*3/uL (ref 4.0–10.5)
WBC: 8.5 10*3/uL (ref 4.0–10.5)
nRBC: 0 % (ref 0.0–0.2)
nRBC: 0 % (ref 0.0–0.2)

## 2018-12-26 LAB — COMPREHENSIVE METABOLIC PANEL
ALT: 10 U/L (ref 0–44)
AST: 19 U/L (ref 15–41)
Albumin: 2.6 g/dL — ABNORMAL LOW (ref 3.5–5.0)
Alkaline Phosphatase: 171 U/L — ABNORMAL HIGH (ref 38–126)
Anion gap: 7 (ref 5–15)
BUN: 8 mg/dL (ref 6–20)
CO2: 22 mmol/L (ref 22–32)
Calcium: 8.9 mg/dL (ref 8.9–10.3)
Chloride: 107 mmol/L (ref 98–111)
Creatinine, Ser: 0.59 mg/dL (ref 0.44–1.00)
Glucose, Bld: 114 mg/dL — ABNORMAL HIGH (ref 70–99)
Potassium: 3.7 mmol/L (ref 3.5–5.1)
Sodium: 136 mmol/L (ref 135–145)
TOTAL PROTEIN: 5.7 g/dL — AB (ref 6.5–8.1)
Total Bilirubin: 0.4 mg/dL (ref 0.3–1.2)

## 2018-12-26 LAB — PROTEIN / CREATININE RATIO, URINE
Creatinine, Urine: 69.16 mg/dL
Protein Creatinine Ratio: 0.16 mg/mg{Cre} — ABNORMAL HIGH (ref 0.00–0.15)
TOTAL PROTEIN, URINE: 11 mg/dL

## 2018-12-26 LAB — ABO/RH: ABO/RH(D): A POS

## 2018-12-26 LAB — RPR: RPR Ser Ql: NONREACTIVE

## 2018-12-26 MED ORDER — ACETAMINOPHEN 325 MG PO TABS: 650.0000 mg | ORAL_TABLET | ORAL | Status: DC | PRN

## 2018-12-26 MED ORDER — TERBUTALINE SULFATE 1 MG/ML IJ SOLN
0.2500 mg | Freq: Once | INTRAMUSCULAR | Status: AC
  Administered 2018-12-26: 0.25 mg via SUBCUTANEOUS
  Filled 2018-12-26: qty 1

## 2018-12-26 MED ORDER — MISOPROSTOL 50MCG HALF TABLET
50.0000 ug | ORAL_TABLET | ORAL | Status: DC | PRN
  Administered 2018-12-26 (×3): 50 ug via ORAL
  Filled 2018-12-26 (×3): qty 1

## 2018-12-26 MED ORDER — OXYCODONE-ACETAMINOPHEN 5-325 MG PO TABS: 1.0000 | ORAL_TABLET | ORAL | Status: DC | PRN

## 2018-12-26 MED ORDER — LACTATED RINGERS IV SOLN
INTRAVENOUS | Status: DC
  Administered 2018-12-26 – 2018-12-28 (×7): via INTRAVENOUS

## 2018-12-26 MED ORDER — FENTANYL CITRATE (PF) 100 MCG/2ML IJ SOLN
INTRAMUSCULAR | Status: AC
  Filled 2018-12-26: qty 2

## 2018-12-26 MED ORDER — FENTANYL CITRATE (PF) 100 MCG/2ML IJ SOLN
100.0000 ug | INTRAMUSCULAR | Status: DC | PRN
  Administered 2018-12-26 – 2018-12-27 (×3): 100 ug via INTRAVENOUS
  Filled 2018-12-26 (×2): qty 2

## 2018-12-26 MED ORDER — SOD CITRATE-CITRIC ACID 500-334 MG/5ML PO SOLN
30.0000 mL | ORAL | Status: DC | PRN
  Administered 2018-12-26 – 2018-12-27 (×3): 30 mL via ORAL
  Filled 2018-12-26 (×4): qty 15

## 2018-12-26 MED ORDER — DIPHENHYDRAMINE HCL 50 MG/ML IJ SOLN
25.0000 mg | Freq: Three times a day (TID) | INTRAMUSCULAR | Status: DC | PRN
  Administered 2018-12-26: 25 mg via INTRAVENOUS
  Filled 2018-12-26: qty 1

## 2018-12-26 MED ORDER — OXYCODONE-ACETAMINOPHEN 5-325 MG PO TABS: 2.0000 | ORAL_TABLET | ORAL | Status: DC | PRN

## 2018-12-26 MED ORDER — LIDOCAINE HCL (PF) 1 % IJ SOLN: 30.0000 mL | INTRAMUSCULAR | Status: DC | PRN

## 2018-12-26 MED ORDER — ONDANSETRON HCL 4 MG/2ML IJ SOLN: 4.0000 mg | Freq: Four times a day (QID) | INTRAMUSCULAR | Status: DC | PRN

## 2018-12-26 MED ORDER — TERBUTALINE SULFATE 1 MG/ML IJ SOLN: 0.2500 mg | Freq: Once | INTRAMUSCULAR | Status: DC | PRN

## 2018-12-26 MED ORDER — OXYTOCIN 40 UNITS IN NORMAL SALINE INFUSION - SIMPLE MED
2.5000 [IU]/h | INTRAVENOUS | Status: DC
  Administered 2018-12-28: 2.5 [IU]/h via INTRAVENOUS
  Filled 2018-12-26: qty 1000

## 2018-12-26 MED ORDER — MISOPROSTOL 25 MCG QUARTER TABLET
25.0000 ug | ORAL_TABLET | ORAL | Status: DC | PRN
  Administered 2018-12-26: 25 ug via VAGINAL
  Filled 2018-12-26: qty 1

## 2018-12-26 MED ORDER — LACTATED RINGERS IV SOLN
500.0000 mL | INTRAVENOUS | Status: DC | PRN
  Administered 2018-12-26 – 2018-12-28 (×5): 500 mL via INTRAVENOUS

## 2018-12-26 MED ORDER — OXYTOCIN BOLUS FROM INFUSION
500.0000 mL | Freq: Once | INTRAVENOUS | Status: AC
  Administered 2018-12-28: 500 mL/h via INTRAVENOUS

## 2018-12-26 NOTE — Progress Notes (Signed)
Labor Progress Note Anna Hines is a 38 y.o. G1P0 at [redacted]w[redacted]d presented for breech here for ECV and IOL due to cholestasis, Being induced after successful ECV currently   S:  Comfortable and planning second dose of cytotec and now will be switching to PO, denies HA, RUQ pain, Visual issues, LE edema or pain, No SOB.   O:  BP 117/70   Pulse 95   Temp 98.3 F (36.8 C) (Oral)   Resp 16   Ht 5\' 5"  (1.651 m)   Wt 90.3 kg   LMP 04/10/2018   BMI 33.12 kg/m  EFM: baseline 140 bpm/ >6 variability/ + accels/ - decels  Toco: Irritable occasional  SVE: Dilation: Closed Effacement (%): Thick Cervical Position: Posterior Station: Ballotable Presentation: Vertex Exam by:: [name] Pitocin: _ mu/min Cytotec 50 mcg PO x2  A/P: 38 y.o. G1P0 [redacted]w[redacted]d  1. Labor: Not in labour starting ripening  2. FWB: Cat 1 3. Pain: mild   Anticipate SVD 2/2 IOL  Cervical ripening currently on second dose of Cytotec now giving buccally  Cervical check 4 hrs post cytotec and evaluation of Pit/AROM  Desires Epidural on request Next check at 1800hrs   [name], MD PGY 1 Family Medicine Resident [facility] [place]  2:05 PM

## 2018-12-26 NOTE — Progress Notes (Signed)
LABOR PROGRESS NOTE  Anna Hines is a 38 y.o. G1P0 at [redacted]w[redacted]d  admitted for IOL for cholestasis  Subjective: Feeling mild pain with contractions 2/10 Otherwise no concerns  Objective: BP (!) 135/100 Comment: Pt anxious   Pulse 92   Temp 98.7 F (37.1 C) (Oral)   Resp 18   Ht 5\' 5"  (1.651 m)   Wt 90.3 kg   LMP 04/10/2018   BMI 33.12 kg/m  or  Vitals:   12/26/18 1857 12/26/18 1932 12/26/18 2056 12/26/18 2207  BP: (!) 136/59 132/84 (!) 138/94 (!) 135/100  Pulse: (!) 109 100 98 92  Resp:  16 16 18   Temp:    98.7 F (37.1 C)  TempSrc:    Oral  Weight:      Height:        Dilation: 2 Effacement (%): Thick Cervical Position: Posterior Station: -2 Presentation: Vertex Exam by:: C Neill FHT: baseline rate 140, moderate varibility, + acel, no decel Toco: regular q2-98m  Labs: Lab Results  Component Value Date   WBC 8.5 12/26/2018   HGB 11.0 (L) 12/26/2018   HCT 35.1 (L) 12/26/2018   MCV 83.8 12/26/2018   PLT 205 12/26/2018    Patient Active Problem List   Diagnosis Date Noted  . Cholestasis 12/26/2018  . Breech presentation 12/26/2018  . Cholestasis during pregnancy 12/21/2018  . Itching 12/16/2018  . Excessive weight gain during pregnancy 09/01/2018  . Supervision of other normal pregnancy, antepartum 06/16/2018  . Anxiety 08/11/2017  . Failure to attend appointment 05/23/2016  . Counseling on health promotion and disease prevention 04/18/2016  . History of hyperlipidemia 04/18/2016  . Generalized anxiety disorder 04/11/2016    Assessment / Plan: 38 y.o. G1P0 at [redacted]w[redacted]d here for IOL for cholestasis   Labor: FB placed with 25ml of saline. Continue PO cytotec until FB out. Then start pitocin.  Fetal Wellbeing:  Cat  Pain Control:  Fentanyl during FB placement Anticipated MOD:  SVD  Gwenevere Abbot, MD OB Fellow  12/26/2018, 10:56 PM

## 2018-12-26 NOTE — H&P (Signed)
  Anna Hines is an 38 y.o. G1P0 [redacted]w[redacted]d female.   Chief Complaint: breech HPI: here for ECV and IOL due to cholestasis.  Past Medical History:  Diagnosis Date  . Cholestasis during pregnancy   . Hyperlipidemia     Past Surgical History:  Procedure Laterality Date  . WISDOM TOOTH EXTRACTION      Family History  Problem Relation Age of Onset  . Cancer Mother        CERVICAL  . Hyperlipidemia Father   . Alcohol abuse Maternal Grandmother   . Cancer Maternal Grandmother        BREAST  . Stroke Maternal Grandmother   . Hyperlipidemia Paternal Grandmother   . Heart attack Paternal Grandfather    Social History:  reports that she has quit smoking. She has never used smokeless tobacco. She reports previous alcohol use. She reports that she does not use drugs.   No Known Allergies  Medications Prior to Admission  Medication Sig Dispense Refill  . albuterol (PROVENTIL HFA;VENTOLIN HFA) 108 (90 Base) MCG/ACT inhaler Inhale 1-2 puffs into the lungs every 6 (six) hours as needed for wheezing or shortness of breath. (Patient not taking: Reported on 06/16/2018) 1 Inhaler 0  . cetirizine-pseudoephedrine (ZYRTEC-D ALLERGY & CONGESTION) 5-120 MG tablet Take 1 tablet by mouth 2 (two) times daily. (Patient not taking: Reported on 10/30/2018) 30 tablet 5  . hydrOXYzine (VISTARIL) 25 MG capsule Take 1 capsule (25 mg total) by mouth 3 (three) times daily as needed for itching. 30 capsule 0  . ursodiol (ACTIGALL) 300 MG capsule Take 1 capsule (300 mg total) by mouth 2 (two) times daily. 30 capsule 0     A comprehensive review of systems was negative.  Blood pressure (!) 135/98, pulse (!) 105, temperature 97.7 F (36.5 C), temperature source Oral, resp. rate 18, height 5\' 5"  (1.651 m), weight 90.3 kg, last menstrual period 04/10/2018. General appearance: alert, cooperative and appears stated age Head: Normocephalic, without obvious abnormality, atraumatic Neck: supple, symmetrical, trachea  midline Lungs: normal effort Heart: regular rate and rhythm Abdomen: soft, non-tender; bowel sounds normal; no masses,  no organomegaly Extremities: Homans sign is negative, no sign of DVT Skin: Skin color, texture, turgor normal. No rashes or lesions Neurologic: Grossly normal   Lab Results  Component Value Date   WBC 10.3 10/30/2018   HGB 12.3 10/30/2018   HCT 36.0 10/30/2018   MCV 85.9 10/30/2018   PLT 252 10/30/2018         ABO, Rh: A/RH(D) POSITIVE/-- (08/27 1344)  Antibody: NO ANTIBODIES DETECTED (08/27 1344)  Rubella: 0.94 (08/27 1344)  RPR: NON-REACTIVE (01/10 0920)  HBsAg: NON-REACTIVE (08/27 1344)  HIV: NON-REACTIVE (01/10 0920)  GBS:       Assessment/Plan Principal Problem:   Cholestasis during pregnancy Active Problems:   Breech presentation  For attempt ECV--if successful IOL, if not for primary CS with BTL.  Reva Bores 12/26/2018, 8:51 AM

## 2018-12-26 NOTE — Anesthesia Pain Management Evaluation Note (Signed)
  CRNA Pain Management Visit Note  Patient: Anna Hines, 38 y.o., female  "Hello I am a member of the anesthesia team at [facility]. We have an anesthesia team available at all times to provide care throughout the hospital, including epidural management and anesthesia for C-section. I don't know your plan for the delivery whether it a natural birth, water birth, IV sedation, nitrous supplementation, doula or epidural, but we want to meet your pain goals."   1.Was your pain managed to your expectations on prior hospitalizations?   No prior hospitalizations  2.What is your expectation for pain management during this hospitalization?     Epidural  3.How can we help you reach that goal? Support prn  Record the patient's initial score and the patient's pain goal.   Pain: 0  Pain Goal: 3 [facility] wants you to be able to say your pain was always managed very well.  [facility] 12/26/2018

## 2018-12-26 NOTE — Progress Notes (Addendum)
Labor Progress Note Anna Hines is a 38 y.o. G1P0 at [redacted]w[redacted]d presented for breech here for ECV and IOL due to cholestasis, Being induced after successful ECV currently   S:  Comfortable and gotten her first dose of PV cytotec, denies HA, RUQ pain, Visual issues, LE edema or pain, No SOB.   O:  BP 122/83   Pulse (!) 109   Temp 97.7 F (36.5 C) (Oral)   Resp 16   Ht 5\' 5"  (1.651 m)   Wt 90.3 kg   LMP 04/10/2018   BMI 33.12 kg/m  EFM: baseline 150 bpm/ >6 variability/ + accels/ - decels  Toco: irritable  SVE: Dilation: Closed Effacement (%): Thick Cervical Position: Posterior Station: Ballotable Presentation: Vertex Exam by:: [name] Pitocin: _ mu/min Cytotec PV x1   A/P: 38 y.o. G1P0 [redacted]w[redacted]d  1. Labor: Not in labour starting ripening  2. FWB: Cat 1 3. Pain: mild   Anticipate SVD 2/2 IOL  Cervical ripening currently  Cervical check 4 hrs post cytotec and evaluation of Pit/AROM  Desires Epidural on request  [name], MD PGY 1 Family Medicine Resident [facility]  10:31 AM

## 2018-12-26 NOTE — Progress Notes (Signed)
Labor Progress Note Anna Hines is a 38 y.o. G1P0 at [redacted]w[redacted]d presented for IOL for cholestasis  S:  Patient reporting mild cramping  O:  BP (!) 144/100   Pulse (!) 103   Temp 98.1 F (36.7 C) (Oral)   Resp 16   Ht 5\' 5"  (1.651 m)   Wt 90.3 kg   LMP 04/10/2018   BMI 33.12 kg/m   Fetal Tracing:  Baseline: 160  Variability: moderate Accels: 15x15 Decels: none  Toco: UI  CVE: Dilation: Closed Effacement (%): Thick Cervical Position: Posterior Station: Ballotable Presentation: Vertex Exam by:: Ma Hillock   A&P: 38 y.o. G1P0 [redacted]w[redacted]d IOL for cholestasis #Labor: No cervical change. Will continue with cytotec #Pain: n/a #FWB: Cat 1 #GBS negative  Rolm Bookbinder, CNM 6:20 PM

## 2018-12-26 NOTE — Procedures (Signed)
After informed verbal consent, Terbutaline 0.25 mg SQ given, ECV was attempted under Ultrasound guidance.  Forward roll attempted x 1 with success.   FHR was reactive before and after the procedure.   Pt. Tolerated the procedure well.

## 2018-12-27 ENCOUNTER — Inpatient Hospital Stay (HOSPITAL_COMMUNITY): Payer: BLUE CROSS/BLUE SHIELD | Admitting: Anesthesiology

## 2018-12-27 DIAGNOSIS — O133 Gestational [pregnancy-induced] hypertension without significant proteinuria, third trimester: Secondary | ICD-10-CM | POA: Diagnosis present

## 2018-12-27 HISTORY — DX: Gestational (pregnancy-induced) hypertension without significant proteinuria, third trimester: O13.3

## 2018-12-27 MED ORDER — EPHEDRINE 5 MG/ML INJ: 10.0000 mg | INTRAVENOUS | Status: DC | PRN

## 2018-12-27 MED ORDER — PHENYLEPHRINE 40 MCG/ML (10ML) SYRINGE FOR IV PUSH (FOR BLOOD PRESSURE SUPPORT)
80.0000 ug | PREFILLED_SYRINGE | INTRAVENOUS | Status: DC | PRN
  Filled 2018-12-27: qty 10

## 2018-12-27 MED ORDER — DIPHENHYDRAMINE HCL 50 MG/ML IJ SOLN: 12.5000 mg | INTRAMUSCULAR | Status: DC | PRN

## 2018-12-27 MED ORDER — LIDOCAINE HCL (PF) 1 % IJ SOLN
INTRAMUSCULAR | Status: DC | PRN
  Administered 2018-12-27 (×2): 6 mL

## 2018-12-27 MED ORDER — OXYTOCIN 40 UNITS IN NORMAL SALINE INFUSION - SIMPLE MED
1.0000 m[IU]/min | INTRAVENOUS | Status: DC
  Administered 2018-12-27 (×2): 2 m[IU]/min via INTRAVENOUS
  Filled 2018-12-27: qty 1000

## 2018-12-27 MED ORDER — ACETAMINOPHEN 500 MG PO TABS
1000.0000 mg | ORAL_TABLET | Freq: Once | ORAL | Status: AC
  Administered 2018-12-27: 1000 mg via ORAL
  Filled 2018-12-27: qty 2

## 2018-12-27 MED ORDER — FENTANYL-BUPIVACAINE-NACL 0.5-0.125-0.9 MG/250ML-% EP SOLN
12.0000 mL/h | EPIDURAL | Status: DC | PRN
  Administered 2018-12-27: 12 mL/h via EPIDURAL
  Filled 2018-12-27 (×2): qty 250

## 2018-12-27 MED ORDER — LACTATED RINGERS IV SOLN
500.0000 mL | Freq: Once | INTRAVENOUS | Status: AC
  Administered 2018-12-27: 500 mL via INTRAVENOUS

## 2018-12-27 MED ORDER — PHENYLEPHRINE 40 MCG/ML (10ML) SYRINGE FOR IV PUSH (FOR BLOOD PRESSURE SUPPORT)
80.0000 ug | PREFILLED_SYRINGE | INTRAVENOUS | Status: DC | PRN
  Administered 2018-12-27: 80 ug via INTRAVENOUS

## 2018-12-27 MED ORDER — TERBUTALINE SULFATE 1 MG/ML IJ SOLN: 0.2500 mg | Freq: Once | INTRAMUSCULAR | Status: DC | PRN

## 2018-12-27 MED ORDER — LIDOCAINE HCL (PF) 1 % IJ SOLN: INTRAMUSCULAR | Status: DC | PRN

## 2018-12-27 MED ORDER — SODIUM CHLORIDE (PF) 0.9 % IJ SOLN
INTRAMUSCULAR | Status: DC | PRN
  Administered 2018-12-27: 12 mL/h via EPIDURAL

## 2018-12-27 NOTE — Progress Notes (Signed)
LABOR PROGRESS NOTE  Anna Hines is a 38 y.o. G1P0 at [redacted]w[redacted]d  admitted for IOL for cholestasis.   Subjective: Comfortable with epidural. Endorses itching. FOB at bedside.   Objective: BP 116/83   Pulse 95   Temp 98.1 F (36.7 C) (Oral)   Resp 12   Ht 5\' 5"  (1.651 m)   Wt 90.3 kg   LMP 04/10/2018   BMI 33.12 kg/m  or  Vitals:   12/27/18 0931 12/27/18 1001 12/27/18 1028 12/27/18 1031  BP: 114/77 132/86  116/83  Pulse: 82 (!) 103  95  Resp: 14 16 12    Temp:      TempSrc:      Weight:      Height:        Dilation: 6 Effacement (%): 80 Cervical Position: Midline  Station: -1 Presentation: Vertex Exam by:: Dr. Earlene Plater FHT: baseline rate 135, moderate varibility, +acel, no decel Toco: q2-4 min   Labs: Lab Results  Component Value Date   WBC 8.5 12/26/2018   HGB 11.0 (L) 12/26/2018   HCT 35.1 (L) 12/26/2018   MCV 83.8 12/26/2018   PLT 205 12/26/2018    Patient Active Problem List   Diagnosis Date Noted  . Cholestasis 12/26/2018  . Breech presentation 12/26/2018  . Cholestasis during pregnancy 12/21/2018  . Itching 12/16/2018  . Excessive weight gain during pregnancy 09/01/2018  . Supervision of other normal pregnancy, antepartum 06/16/2018  . Anxiety 08/11/2017  . Failure to attend appointment 05/23/2016  . Counseling on health promotion and disease prevention 04/18/2016  . History of hyperlipidemia 04/18/2016  . Generalized anxiety disorder 04/11/2016    Assessment / Plan: 38 y.o. G1P0 at [redacted]w[redacted]d here for IOL for cholestasis.   Labor: Induction. S/p FB and cytotec. Fetal head now more engaged in pelvis and against the cervix. AROM performed with clear large amount of clear fluid. On pitocin at 14 mu/min. Contraction pattern difficult to interpret. IUPC placed to assess MVUs and titrate Pitocin as appropriate.  Fetal Wellbeing:  Cat I  Pain Control:  Epidural in place  Anticipated MOD:  NSVD   Marcy Siren, D.O. OB Fellow  12/27/2018, 10:53 AM

## 2018-12-27 NOTE — Progress Notes (Signed)
Labor Progress Note  Subjective: Introduced self to patient. Pt resting comfortably. Pt denies any complaints. Support person at bedside  Objective: BP 131/85   Pulse (!) 112   Temp (!) 100.9 F (38.3 C) (Axillary)   Resp 18   Ht 5\' 5"  (1.651 m)   Wt 90.3 kg   LMP 04/10/2018   BMI 33.12 kg/m  Gen: well-appearing, no distress Dilation: 6 Effacement (%): 80 Cervical Position: Posterior Station: -1 Presentation: Vertex Exam by:: Verdie Drown, RN  Fetal Well-Being: -- Category 1; FHR 155, moderate variability, +accels, no decels -- Contractions q6 mins  Assessment and Plan: 38 y.o. G1P0 [redacted]w[redacted]d IOL for cholestasis.   Labor:  -- Pitocin turned off at 7pm. Will resume pitocin around 11pm.  --Isolated maternal temperature. Per the NICHD guidelines, antibiotics not required at this time. Will continue to monitor maternal temperature and FHR.   -- Pain control: epidural -- PPH Risk: low    Camelia Eng, SNM 8:50 PM

## 2018-12-27 NOTE — Progress Notes (Signed)
LABOR PROGRESS NOTE  Anna Hines is a 38 y.o. G1P0 at [redacted]w[redacted]d  admitted for IOL for cholestasis.   Subjective: Strip note. Discussed plan of care with RN.   Objective: BP 119/83   Pulse (!) 102   Temp 98.4 F (36.9 C) (Oral)   Resp 16   Ht 5\' 5"  (1.651 m)   Wt 90.3 kg   LMP 04/10/2018   BMI 33.12 kg/m  or  Vitals:   12/27/18 1731 12/27/18 1801 12/27/18 1831 12/27/18 1901  BP: 132/70 130/77 123/73 119/83  Pulse: 97 (!) 102 (!) 102 (!) 102  Resp: 16 14 16 16   Temp:      TempSrc:      Weight:      Height:        Dilation: 6 Effacement (%): 80 Cervical Position: Midline  Station: -1 Presentation: Vertex Exam by:: Dr. Earlene Plater FHT: baseline rate 160, moderate varibility, +acel, early decel Toco: q1-3 min   Labs: Lab Results  Component Value Date   WBC 8.5 12/26/2018   HGB 11.0 (L) 12/26/2018   HCT 35.1 (L) 12/26/2018   MCV 83.8 12/26/2018   PLT 205 12/26/2018    Patient Active Problem List   Diagnosis Date Noted  . Cholestasis 12/26/2018  . Breech presentation 12/26/2018  . Cholestasis during pregnancy 12/21/2018  . Itching 12/16/2018  . Excessive weight gain during pregnancy 09/01/2018  . Supervision of other normal pregnancy, antepartum 06/16/2018  . Anxiety 08/11/2017  . Failure to attend appointment 05/23/2016  . Counseling on health promotion and disease prevention 04/18/2016  . History of hyperlipidemia 04/18/2016  . Generalized anxiety disorder 04/11/2016    Assessment / Plan: 38 y.o. G1P0 at [redacted]w[redacted]d here for IOL for cholestasis.   Labor: Induction. S/p FB and cytotec. AROM performed this AM at 1040. Cervix remained unchanged despite Pitocin at 28 mu/min. Was borderline adequate with MVUs intermittently only for the past couple of hours. Will plan for 4 hour Pitocin break. Resume around 2300.  Fetal Wellbeing:  Cat I  Pain Control:  Epidural in place  Anticipated MOD:  NSVD   Marcy Siren, D.O. OB Fellow  12/27/2018, 7:12 PM

## 2018-12-27 NOTE — Anesthesia Preprocedure Evaluation (Signed)
Anesthesia Evaluation  Patient identified by MRN, date of birth, ID band Patient awake    Reviewed: Allergy & Precautions, H&P , NPO status , Patient's Chart, lab work & pertinent test results  Airway Mallampati: II  TM Distance: >3 FB Neck ROM: full    Dental no notable dental hx. (+) Teeth Intact   Pulmonary former smoker,    Pulmonary exam normal breath sounds clear to auscultation       Cardiovascular negative cardio ROS Normal cardiovascular exam Rhythm:regular Rate:Normal     Neuro/Psych negative neurological ROS     GI/Hepatic negative GI ROS, Neg liver ROS,   Endo/Other  negative endocrine ROS  Renal/GU negative Renal ROS  negative genitourinary   Musculoskeletal negative musculoskeletal ROS (+)   Abdominal (+) + obese,   Peds  Hematology negative hematology ROS (+)   Anesthesia Other Findings   Reproductive/Obstetrics (+) Pregnancy                             Anesthesia Physical Anesthesia Plan  ASA: II  Anesthesia Plan: Epidural   Post-op Pain Management:    Induction:   PONV Risk Score and Plan:   Airway Management Planned:   Additional Equipment:   Intra-op Plan:   Post-operative Plan:   Informed Consent: I have reviewed the patients History and Physical, chart, labs and discussed the procedure including the risks, benefits and alternatives for the proposed anesthesia with the patient or authorized representative who has indicated his/her understanding and acceptance.       Plan Discussed with:   Anesthesia Plan Comments:         Anesthesia Quick Evaluation

## 2018-12-27 NOTE — Anesthesia Procedure Notes (Signed)
Epidural Patient location during procedure: OB Start time: 12/27/2018 4:42 AM End time: 12/27/2018 4:50 AM  Staffing Anesthesiologist: Leilani Able, MD Performed: anesthesiologist   Preanesthetic Checklist Completed: patient identified, site marked, surgical consent, pre-op evaluation, timeout performed, IV checked, risks and benefits discussed and monitors and equipment checked  Epidural Patient position: sitting Prep: site prepped and draped and DuraPrep Patient monitoring: continuous pulse ox and blood pressure Approach: midline Location: L3-L4 Injection technique: LOR air  Needle:  Needle type: Tuohy  Needle gauge: 17 G Needle length: 9 cm and 9 Needle insertion depth: 6 cm Catheter type: closed end flexible Catheter size: 19 Gauge Catheter at skin depth: 11 cm Test dose: negative and Other  Assessment Sensory level: T9 Events: blood not aspirated, injection not painful, no injection resistance, negative IV test and no paresthesia  Additional Notes Reason for block:procedure for pain

## 2018-12-27 NOTE — Progress Notes (Signed)
Patient ID: Anna Hines, female   DOB: 25-Oct-1980, 38 y.o.   MRN: [id number] Anna Hines is a 38 y.o. G1P0 at [redacted]w[redacted]d.  Subjective: Comfortable w. Epidural.   Objective: BP 110/63   Pulse (!) 102   Temp 98.8 F (37.1 C) (Axillary)   Resp 16   Ht 5\' 5"  (1.651 m)   Wt 90.3 kg   LMP 04/10/2018   BMI 33.12 kg/m    FHT:  FHR: 135-140 bpm, variability: mod,  accelerations:  15x15,  decelerations:  none UC:   Q 5-6 minutes, 20-30. MVU's 50 Dilation: 6 Effacement (%): 80 Cervical Position: Posterior Station: -1 Presentation: Vertex Exam by:: Verdie Drown, RN   VE deferred due to ROM w/ inadequate MVU's. Vertex verified by BS [place].   Labs: No results found for this or any previous visit (from the past 24 hour(s)).  Assessment / Plan: [redacted]w[redacted]d week IUP Labor: Early, IOL Fetal Wellbeing:  Category I Pain Control:  Epidural Anticipated MOD:  Uncertain. Will restart pitocin and encourage frequent position changes Fever resolved. Will CTO closely.   Katrinka Blazing, [place], CNM 12/27/2018 11:06 PM

## 2018-12-27 NOTE — Progress Notes (Signed)
LABOR PROGRESS NOTE  Anna Hines is a 38 y.o. G1P0 at [redacted]w[redacted]d  admitted for IOL for cholestasis.   Subjective: Called by RN to replace IUPC, had fallen out in the bed. Patient doing well. Support members at bedside.   Objective: BP 132/86   Pulse 99   Temp 98.3 F (36.8 C) (Oral)   Resp 14   Ht 5\' 5"  (1.651 m)   Wt 90.3 kg   LMP 04/10/2018   BMI 33.12 kg/m  or  Vitals:   12/27/18 1400 12/27/18 1431 12/27/18 1501 12/27/18 1531  BP: 126/78 137/89 (!) 146/102 132/86  Pulse: 95 99 (!) 105 99  Resp: 16 16 12 14   Temp:    98.3 F (36.8 C)  TempSrc:    Oral  Weight:      Height:        Dilation: 6 Effacement (%): 80 Cervical Position: Midline  Station: -1 Presentation: Vertex Exam by:: Dr. Earlene Plater FHT: baseline rate 135, moderate varibility, +acel, early decel Toco: q2-4 min   Labs: Lab Results  Component Value Date   WBC 8.5 12/26/2018   HGB 11.0 (L) 12/26/2018   HCT 35.1 (L) 12/26/2018   MCV 83.8 12/26/2018   PLT 205 12/26/2018    Patient Active Problem List   Diagnosis Date Noted  . Cholestasis 12/26/2018  . Breech presentation 12/26/2018  . Cholestasis during pregnancy 12/21/2018  . Itching 12/16/2018  . Excessive weight gain during pregnancy 09/01/2018  . Supervision of other normal pregnancy, antepartum 06/16/2018  . Anxiety 08/11/2017  . Failure to attend appointment 05/23/2016  . Counseling on health promotion and disease prevention 04/18/2016  . History of hyperlipidemia 04/18/2016  . Generalized anxiety disorder 04/11/2016    Assessment / Plan: 38 y.o. G1P0 at [redacted]w[redacted]d here for IOL for cholestasis.   Labor: Induction. S/p FB and cytotec. AROM performed this AM at 1040. Patient continues to leak large amounts of amniotic fluid. Cervix remains unchanged. Now on 28 mu/min of Pitocin. Was previously not having adequate MVUs. Now has MVUs of around 200. Discussed with patient that if continue to titrate Pitocin without cervical change and without  consistently adequate MVUs would likely plan for pit break.  Fetal Wellbeing:  Cat I  Pain Control:  Epidural in place  Anticipated MOD:  NSVD   Marcy Siren, D.O. OB Fellow  12/27/2018, 3:52 PM

## 2018-12-28 ENCOUNTER — Encounter (HOSPITAL_COMMUNITY): Payer: Self-pay | Admitting: *Deleted

## 2018-12-28 DIAGNOSIS — Z3A37 37 weeks gestation of pregnancy: Secondary | ICD-10-CM

## 2018-12-28 DIAGNOSIS — O901 Disruption of perineal obstetric wound: Secondary | ICD-10-CM

## 2018-12-28 DIAGNOSIS — K831 Obstruction of bile duct: Secondary | ICD-10-CM

## 2018-12-28 DIAGNOSIS — Z3490 Encounter for supervision of normal pregnancy, unspecified, unspecified trimester: Secondary | ICD-10-CM | POA: Diagnosis present

## 2018-12-28 DIAGNOSIS — O2662 Liver and biliary tract disorders in childbirth: Secondary | ICD-10-CM

## 2018-12-28 HISTORY — DX: Encounter for supervision of normal pregnancy, unspecified, unspecified trimester: Z34.90

## 2018-12-28 MED ORDER — PRENATAL MULTIVITAMIN CH: 1.0000 | ORAL_TABLET | Freq: Every day | ORAL | Status: DC

## 2018-12-28 MED ORDER — IBUPROFEN 600 MG PO TABS
600.0000 mg | ORAL_TABLET | Freq: Four times a day (QID) | ORAL | Status: DC
  Administered 2018-12-28 – 2018-12-30 (×9): 600 mg via ORAL
  Filled 2018-12-28 (×8): qty 1

## 2018-12-28 MED ORDER — OXYCODONE-ACETAMINOPHEN 5-325 MG PO TABS: 1.0000 | ORAL_TABLET | ORAL | Status: DC | PRN

## 2018-12-28 MED ORDER — MISOPROSTOL 200 MCG PO TABS
ORAL_TABLET | ORAL | Status: AC
  Filled 2018-12-28: qty 4

## 2018-12-28 MED ORDER — METHYLERGONOVINE MALEATE 0.2 MG/ML IJ SOLN
INTRAMUSCULAR | Status: AC
  Filled 2018-12-28: qty 1

## 2018-12-28 MED ORDER — SIMETHICONE 80 MG PO CHEW: 80.0000 mg | CHEWABLE_TABLET | ORAL | Status: DC | PRN

## 2018-12-28 MED ORDER — ONDANSETRON HCL 4 MG PO TABS: 4.0000 mg | ORAL_TABLET | ORAL | Status: DC | PRN

## 2018-12-28 MED ORDER — TRANEXAMIC ACID-NACL 1000-0.7 MG/100ML-% IV SOLN
INTRAVENOUS | Status: AC
  Filled 2018-12-28: qty 100

## 2018-12-28 MED ORDER — WITCH HAZEL-GLYCERIN EX PADS: 1.0000 "application " | MEDICATED_PAD | CUTANEOUS | Status: DC | PRN

## 2018-12-28 MED ORDER — SENNOSIDES-DOCUSATE SODIUM 8.6-50 MG PO TABS
2.0000 | ORAL_TABLET | ORAL | Status: DC
  Administered 2018-12-29 – 2018-12-30 (×2): 2 via ORAL
  Filled 2018-12-28 (×2): qty 2

## 2018-12-28 MED ORDER — ONDANSETRON HCL 4 MG/2ML IJ SOLN: 4.0000 mg | INTRAMUSCULAR | Status: DC | PRN

## 2018-12-28 MED ORDER — MISOPROSTOL 200 MCG PO TABS
800.0000 ug | ORAL_TABLET | Freq: Once | ORAL | Status: AC
  Administered 2018-12-28: 800 ug via VAGINAL

## 2018-12-28 MED ORDER — ACETAMINOPHEN 500 MG PO TABS
1000.0000 mg | ORAL_TABLET | Freq: Once | ORAL | Status: AC
  Administered 2018-12-28: 1000 mg via ORAL
  Filled 2018-12-28: qty 2

## 2018-12-28 MED ORDER — BENZOCAINE-MENTHOL 20-0.5 % EX AERO
1.0000 "application " | INHALATION_SPRAY | CUTANEOUS | Status: DC | PRN
  Filled 2018-12-28: qty 56

## 2018-12-28 MED ORDER — COCONUT OIL OIL: 1.0000 "application " | TOPICAL_OIL | Status: DC | PRN

## 2018-12-28 MED ORDER — ALBUTEROL SULFATE (2.5 MG/3ML) 0.083% IN NEBU: 3.0000 mL | INHALATION_SOLUTION | Freq: Four times a day (QID) | RESPIRATORY_TRACT | Status: DC | PRN

## 2018-12-28 MED ORDER — ACETAMINOPHEN 325 MG PO TABS: 650.0000 mg | ORAL_TABLET | ORAL | Status: DC | PRN

## 2018-12-28 MED ORDER — TETANUS-DIPHTH-ACELL PERTUSSIS 5-2.5-18.5 LF-MCG/0.5 IM SUSP: 0.5000 mL | Freq: Once | INTRAMUSCULAR | Status: DC

## 2018-12-28 MED ORDER — HYDROXYZINE PAMOATE 25 MG PO CAPS
25.0000 mg | ORAL_CAPSULE | Freq: Three times a day (TID) | ORAL | Status: DC | PRN
  Filled 2018-12-28 (×2): qty 1

## 2018-12-28 MED ORDER — DIPHENHYDRAMINE HCL 25 MG PO CAPS: 25.0000 mg | ORAL_CAPSULE | Freq: Four times a day (QID) | ORAL | Status: DC | PRN

## 2018-12-28 MED ORDER — LORATADINE 10 MG PO TABS
10.0000 mg | ORAL_TABLET | Freq: Every day | ORAL | Status: DC
  Administered 2018-12-29 – 2018-12-30 (×2): 10 mg via ORAL
  Filled 2018-12-28 (×2): qty 1

## 2018-12-28 MED ORDER — TRANEXAMIC ACID-NACL 1000-0.7 MG/100ML-% IV SOLN: 1000.0000 mg | Freq: Once | INTRAVENOUS | Status: DC

## 2018-12-28 MED ORDER — PRENATAL MULTIVITAMIN CH
1.0000 | ORAL_TABLET | Freq: Every day | ORAL | Status: DC
  Administered 2018-12-28 – 2018-12-30 (×3): 1 via ORAL
  Filled 2018-12-28 (×2): qty 1

## 2018-12-28 MED ORDER — POLYETHYLENE GLYCOL 3350 17 G PO PACK
17.0000 g | PACK | Freq: Every day | ORAL | Status: DC
  Administered 2018-12-29 – 2018-12-30 (×2): 17 g via ORAL
  Filled 2018-12-28 (×3): qty 1

## 2018-12-28 MED ORDER — ZOLPIDEM TARTRATE 5 MG PO TABS: 5.0000 mg | ORAL_TABLET | Freq: Every evening | ORAL | Status: DC | PRN

## 2018-12-28 MED ORDER — DIBUCAINE 1 % RE OINT: 1.0000 "application " | TOPICAL_OINTMENT | RECTAL | Status: DC | PRN

## 2018-12-28 MED ORDER — METHYLERGONOVINE MALEATE 0.2 MG/ML IJ SOLN
0.2000 mg | Freq: Once | INTRAMUSCULAR | Status: AC
  Administered 2018-12-28: 0.2 mg via INTRAMUSCULAR

## 2018-12-28 MED ORDER — OXYCODONE-ACETAMINOPHEN 5-325 MG PO TABS: 2.0000 | ORAL_TABLET | ORAL | Status: DC | PRN

## 2018-12-28 NOTE — Discharge Summary (Addendum)
Postpartum Discharge Summary   Patient Name: Anna Hines DOB: [birth year] MRN: [id number]  Date of admission: 12/26/2018 Delivering Provider: Lennox Pippins B   Date of discharge: 12/30/2018  Admitting diagnosis: pregnancy Intrauterine pregnancy: [redacted]w[redacted]d     Secondary diagnosis:  Principal Problem:   Cholestasis during pregnancy Active Problems:   Generalized anxiety disorder   Breech presentation   Gestational hypertension, third trimester   Postpartum hemorrhage   Encounter for induction of labor   Obstetric vaginal laceration with second degree perineal laceration   Maternal fever during labor  Additional problems: PPH, gHTN  Discharge diagnosis: Term Pregnancy Delivered                                                         Post partum procedures:None Augmentation: AROM, Pitocin and Cytotec Complications: Hemorrhage>1040mL - HgB trended from 11>8.1  Hospital course:  Onset of Labor With Vaginal Delivery     38 y.o. yo G1P1001 at [redacted]w[redacted]d was admitted in Active Labor on 12/26/2018. Patient had labor course as follows:  Membrane Rupture Time/Date: 10:44 AM ,12/27/2018   Intrapartum Procedures: Episiotomy: None [1]                                         Lacerations:  2nd degree [3];Perineal [11]  Patient had a delivery of a Viable infant with PPH of and PP HgB of 8.1 from 11 and was put on PO iron also repair of 2nd degree perineal laceration.   12/28/2018  Information for the patient's newborn:  Marigrace, Beatrice [[id number]]  Delivery Method: Vaginal, Spontaneous(Filed from Delivery Summary)   Patient had an uncomplicated postpartum course. She did have moderate range pressures, 130s/90s, so she was started on enalapril prior to discharge. She is ambulating, tolerating a regular diet, passing flatus, and urinating well. Patient is discharged home in stable condition on 12/30/18.  Magnesium Sulfate recieved: No BMZ received: No  Physical exam  Vitals:   12/29/18 0544  12/29/18 1609 12/29/18 2211 12/30/18 0714  BP: (!) 120/91 126/85 121/79 (!) 131/95  Pulse: 93 94 96 79  Resp: 18 16 16 18   Temp: 97.9 F (36.6 C) 97.7 F (36.5 C) 98 F (36.7 C) 97.8 F (36.6 C)  TempSrc: Oral Oral Oral Oral  SpO2:   98%   Weight:      Height:       General: alert, cooperative and no distress Lochia: appropriate Uterine Fundus: firm Incision: N/A DVT Evaluation: No cords or calf tenderness. No significant calf/ankle edema. Labs: Lab Results  Component Value Date   WBC 14.7 (H) 12/29/2018   HGB 8.1 (L) 12/29/2018   HCT 25.9 (L) 12/29/2018   MCV 85.8 12/29/2018   PLT 170 12/29/2018   CMP Latest Ref Rng & Units 12/26/2018  Glucose 70 - 99 mg/dL 503(T)  BUN 6 - 20 mg/dL 8  Creatinine 4.65 - 6.81 mg/dL 2.75  Sodium 170 - 017 mmol/L 136  Potassium 3.5 - 5.1 mmol/L 3.7  Chloride 98 - 111 mmol/L 107  CO2 22 - 32 mmol/L 22  Calcium 8.9 - 10.3 mg/dL 8.9  Total Protein 6.5 - 8.1 g/dL 4.9(S)  Total Bilirubin 0.3 - 1.2 mg/dL 0.4  Alkaline  Phos 38 - 126 U/L 171(H)  AST 15 - 41 U/L 19  ALT 0 - 44 U/L 10    Discharge instruction: per After Visit Summary and "Baby and Me Booklet".  After visit meds:  Allergies as of 12/30/2018   No Known Allergies     Medication List    TAKE these medications   acetaminophen 325 MG tablet Commonly known as:  Tylenol Take 2 tablets (650 mg total) by mouth every 4 (four) hours as needed for up to 30 doses (for pain scale < 4).   albuterol 108 (90 Base) MCG/ACT inhaler Commonly known as:  PROVENTIL HFA;VENTOLIN HFA Inhale 1-2 puffs into the lungs every 6 (six) hours as needed for wheezing or shortness of breath.   cetirizine-pseudoephedrine 5-120 MG tablet Commonly known as:  ZyrTEC-D Allergy & Congestion Take 1 tablet by mouth 2 (two) times daily.   enalapril 5 MG tablet Commonly known as:  VASOTEC Take 1 tablet (5 mg total) by mouth daily.   ferrous sulfate 325 (65 FE) MG tablet Take 1 tablet (325 mg total) by mouth  every other day.   hydrOXYzine 25 MG capsule Commonly known as:  Vistaril Take 1 capsule (25 mg total) by mouth 3 (three) times daily as needed for itching.   ibuprofen 800 MG tablet Commonly known as:  ADVIL,MOTRIN Take 1 tablet (800 mg total) by mouth 3 (three) times daily.   prenatal multivitamin Tabs tablet Take 1 tablet by mouth daily at 12 noon.       Diet: routine diet Activity: Advance as tolerated. Pelvic rest for 6 weeks.   Outpatient follow up:1 week for BP check  - appointment sent to clinic Follow up Appt:No future appointments. Follow up Visit: Follow-up Information    [facility] [place]. Schedule an appointment as soon as possible for a visit.   Specialty:  Obstetrics and Gynecology Why:  You should receive a call to schedule a postpartum follow-up. If you do not, please call clinic in 1 week for post partum BP check.  Contact information: [address], [address] [place] [address] [phone]         Please schedule this patient for Postpartum visit in: 1 week with the following provider: Any provider For C/S patients schedule nurse incision check in weeks 2 weeks: no High risk pregnancy complicated by: HTN and cholestasis Delivery mode:  SVD Anticipated Birth Control:  BTL done PP PP Procedures needed: BP check  Schedule Integrated BH visit: no  Newborn Data: Live born female  Birth Weight:   APGAR: 9, 9  Newborn Delivery   Birth date/time:  12/28/2018 10:24:00 Delivery type:  Vaginal, Spontaneous    Baby Feeding: Breast Disposition:home with mother  12/30/2018 Tamera Stands, DO   Attestation: I have seen this patient and agree with the resident's documentation. I have examined them separately, and we have discussed the plan of care.  Cristal Deer. Earlene Plater, DO OB/GYN Fellow

## 2018-12-28 NOTE — Anesthesia Postprocedure Evaluation (Signed)
Anesthesia Post Note  Patient: Anna Hines  Procedure(s) Performed: AN AD HOC LABOR EPIDURAL     Patient location during evaluation: Mother Baby Anesthesia Type: Epidural Level of consciousness: awake and alert Pain management: pain level controlled Vital Signs Assessment: post-procedure vital signs reviewed and stable Respiratory status: spontaneous breathing, nonlabored ventilation and respiratory function stable Cardiovascular status: stable Postop Assessment: no headache, no backache and epidural receding Anesthetic complications: no    Last Vitals:  Vitals:   12/28/18 1245 12/28/18 1347  BP: (!) 122/96 (!) 131/91  Pulse: (!) 115 (!) 107  Resp: 18 17  Temp: 37.7 C 37.1 C  SpO2: 98%     Last Pain:  Vitals:   12/28/18 1347  TempSrc: Oral  PainSc:                  Donaldson Richter

## 2018-12-28 NOTE — Progress Notes (Signed)
Patient ID: LATISSHA SMOLINSKI, female   DOB: Jan 29, 1981, 38 y.o.   MRN: [id number] DAKIRA BABAYEV is a 38 y.o. G1P0 at [redacted]w[redacted]d.  Subjective: Some LLQ pain w/ UC's since lying on right side for a while.  Objective: BP 132/87   Pulse 97   Temp 99.5 F (37.5 C) (Axillary)   Resp 16   Ht 5\' 5"  (1.651 m)   Wt 90.3 kg   LMP 04/10/2018   BMI 33.12 kg/m    FHT:  FHR: 140 bpm, variability: mod,  accelerations:  15x15,  decelerations:  none UC:   Q 3-4 minutes, moderate, MVU's 90-120  Dilation: 8 Effacement (%): 80 Cervical Position: Posterior Station: -1 Presentation: Vertex Exam by:: Dorathy Kinsman, CNM  +caput  Labs: No results found for this or any previous visit (from the past 24 hour(s)).  Assessment / Plan: [redacted]w[redacted]d week IUP Labor: Transition, now making good progress  Fetal Wellbeing:  Category I Pain Control:  Epidural. Turn to left side and use PCA Anticipated MOD:  SVD  Katrinka Blazing [place], CNM 12/28/2018 3:34 AM

## 2018-12-28 NOTE — Progress Notes (Signed)
Labor Progress Note  Subjective: Pt doing well. Comfortable with epidural. Reporting some pelvic pressure.   Objective: BP (!) 138/109   Pulse (!) 169   Temp (!) 100.9 F (38.3 C) (Axillary)   Resp 18   Ht 5\' 5"  (1.651 m)   Wt 90.3 kg   LMP 04/10/2018   BMI 33.12 kg/m   Dilation: 10 Dilation Complete Date: 12/28/18 Dilation Complete Time: 0532 Effacement (%): 100 Cervical Position: Posterior Station: -1, 0 Presentation: Vertex Exam by:: Camelia Eng, CNM  Fetal Well-Being:  -- Category 1, FHR 155,  moderate variability, +accels, no decels -- Toco: 2-5 mins -- Cephalic by SVE.   Assessment and Plan: 38 y.o. G1P0 [redacted]w[redacted]d here for IOL for cholestasis. Pt on Pitocin, progressing well. Cervix now 10/100/-1. Changed position to high fowler's and will let pt labor down. Pt temp 100.9. Will give 500cc bolus of IVFs and 1000mg  of Tylenol.   Labor: Transition, labor down -- pain control: epidural   Camelia Eng, SNM 5:39 AM

## 2018-12-28 NOTE — Lactation Note (Signed)
This note was copied from a baby's chart. Lactation Consultation Note  Patient Name: Anna Hines [id number] Date: 12/28/2018 Reason for consult: Initial assessment;Primapara;1st time breastfeeding;Early term 37-38.6wks  P1 mother whose infant is now 75 hours old.  Mother has no questions related to breast feeding at this time.  Encouraged her to feed 8-12 times/24 hours or sooner if baby shows feeding cues.  Reviewed cues.  Mother has been taught hand expression and has been able to express colostrum which was fed back to baby.  Colostrum container provided and milk storage times reviewed.  Finger feeding demonstrated.    Encouraged mother to call for latch assistance as needed.  Mother will be returning to work after maternity leave and does not have a DEBP for home use.  She has private insurance and I suggested they call their insurance company to obtain a DEBP.  RN in room and assisting mother.   Maternal Data Formula Feeding for Exclusion: No Has patient been taught Hand Expression?: Yes Does the patient have breastfeeding experience prior to this delivery?: No  Feeding Feeding Type: Breast Fed  LATCH Score Latch: Grasps breast easily, tongue down, lips flanged, rhythmical sucking.  Audible Swallowing: None  Type of Nipple: Everted at rest and after stimulation  Comfort (Breast/Nipple): Soft / non-tender  Hold (Positioning): Full assist, staff holds infant at breast  LATCH Score: 6  Interventions    Lactation Tools Discussed/Used WIC Program: No   Consult Status Consult Status: Follow-up Date: 12/29/18 Follow-up type: In-patient    Aldine Chakraborty R Natsuko Kelsay 12/28/2018, 3:13 PM

## 2018-12-29 ENCOUNTER — Encounter: Payer: BLUE CROSS/BLUE SHIELD | Admitting: Advanced Practice Midwife

## 2018-12-29 ENCOUNTER — Encounter (HOSPITAL_COMMUNITY)
Admission: AD | Disposition: A | Discharge status: Home / Self Care | Payer: Self-pay | Source: Home / Self Care | Attending: Obstetrics and Gynecology

## 2018-12-29 LAB — CBC
HCT: 25.9 % — ABNORMAL LOW (ref 36.0–46.0)
Hemoglobin: 8.1 g/dL — ABNORMAL LOW (ref 12.0–15.0)
MCH: 26.8 pg (ref 26.0–34.0)
MCHC: 31.3 g/dL (ref 30.0–36.0)
MCV: 85.8 fL (ref 80.0–100.0)
Platelets: 170 10*3/uL (ref 150–400)
RBC: 3.02 MIL/uL — AB (ref 3.87–5.11)
RDW: 13.9 % (ref 11.5–15.5)
WBC: 14.7 10*3/uL — AB (ref 4.0–10.5)
nRBC: 0 % (ref 0.0–0.2)

## 2018-12-29 SURGERY — LIGATION, FALLOPIAN TUBE, POSTPARTUM
Anesthesia: Epidural

## 2018-12-29 MED ORDER — FERROUS SULFATE 325 (65 FE) MG PO TABS
325.0000 mg | ORAL_TABLET | Freq: Two times a day (BID) | ORAL | Status: DC
  Administered 2018-12-29 – 2018-12-30 (×2): 325 mg via ORAL
  Filled 2018-12-29 (×2): qty 1

## 2018-12-29 NOTE — Lactation Note (Signed)
This note was copied from a baby's chart. Lactation Consultation Note  Patient Name: Anna Hines [id number] Date: 12/29/2018 Reason for consult: Follow-up assessment;Primapara;1st time breastfeeding;Early term 37-38.6wks  P1 mother whose infant is now 52 hours old.  This is an ETI at 37+3 weeks.  RN requested latch assistance.  RN assisting mother with breast feeding when I arrived.  Baby has not been breast feeding well yet.  Offered to assist and mother accepted.  Upon oral assessment baby is "biting" on my gloved finger.  Performed a few minutes of suck training while mother did hand expression.  RN assisted and reviewed hand expression with mother.  She was able to express some colostrum drops which I finger fed back to baby.    Attempted to latch in the cross cradle position on the left breast.  Baby latched easily but became agitated when attempting to get her to suck.  She was fussy and started thrusting arms and pulling off breast.  Released her from the breast and calmed her.  Attempted a second time with the same result.  Burped and calmed again.  Baby latched but would not suck.  Suggested mother try an attempt in a different position and she was willing to try.  Attempted to latch in the football hold on the same breast.  Again, baby latched easily but would not suck and became agitated.  She had been passing gas and I burped her again.  Swaddled her and assisted mother to begin pumping with the DEBP.  Reviewed pump parts and setting.  Demonstrated how to adjust settings for comfort.  Mother will continue to feed 8-12 times/24 hours or sooner if baby shows cues and post pump to help increase milk supply for supplementation.  She will feed back any EBM she obtains to baby.  RN in room and updated.  Mother will call as needed tonight for assistance.  Father present.   Maternal Data Formula Feeding for Exclusion: No Has patient been taught Hand Expression?: Yes Does the patient  have breastfeeding experience prior to this delivery?: No  Feeding Feeding Type: Breast Fed  LATCH Score Latch: Too sleepy or reluctant, no latch achieved, no sucking elicited.  Audible Swallowing: None  Type of Nipple: Everted at rest and after stimulation  Comfort (Breast/Nipple): Soft / non-tender  Hold (Positioning): Assistance needed to correctly position infant at breast and maintain latch.  LATCH Score: 5  Interventions Interventions: Breast feeding basics reviewed;Assisted with latch;Skin to skin;Breast massage;Hand express;Breast compression;Position options;Adjust position;DEBP  Lactation Tools Discussed/Used Breast pump type: Double-Electric Breast Pump WIC Program: No Initiated by:: Already initiated; reviewed with mother   Consult Status Consult Status: Follow-up Date: 12/30/18 Follow-up type: In-patient    Dora Sims 12/29/2018, 5:39 PM

## 2018-12-29 NOTE — Progress Notes (Signed)
Post Partum Day 1 Subjective: Doing well this morning. Was able to get some sleep overnight. States bleeding is doing okay, less than a period. Pain relatively well-controlled. Denies any dizziness or lightheadedness.   Objective: Blood pressure (!) 120/91, pulse 93, temperature 97.9 F (36.6 C), temperature source Oral, resp. rate 18, height 5\' 5"  (1.651 m), weight 90.3 kg, last menstrual period 04/10/2018, SpO2 98 %, unknown if currently breastfeeding.  Physical Exam:  General: alert, well-appearing, NAD Lochia: appropriate Uterine Fundus: firm and appropriately tender Incision: n/a DVT Evaluation: 1+ bilateral pitting edema   Recent Labs    12/26/18 1524 12/29/18 0621  HGB 11.0* 8.1*  HCT 35.1* 25.9*    Assessment/Plan: -- Plan for discharge tomorrow -- PPH and HgB down-trended from 11>8.1, will start po iron and continue to monitor symptoms  -- continue lactation support -- undecided on contraception    LOS: 3 days   Mavis Fichera S Aric Jost 12/29/2018, 8:47 AM

## 2018-12-29 NOTE — Progress Notes (Signed)
CSW received consult for hx of anxiety. CSW met with MOB to offer support and complete assessment.    MOB resting in bed while infant was asleep in basinet, when CSW entered the room. FOB also present in room. CSW introduced self and role. CSW received verbal permission to discuss anything in front of FOB. CSW explained reason for consult to which MOB expressed understanding and reiterated CSW could ask anything in front of FOB. MOB stated she was diagnosed with Generalized Anxiety Disorder "about 2 years ago". MOB stated she sometimes experiences panic attacks and was taking Lexapro but stopped when she found out she was pregnant. MOB not interested in starting Lexapro just yet but is comfortable with reaching out to PCP if concerns or symptoms arise. MOB reported minimal anxiety during pregnancy and stated when it did come up it was manageable. MOB denied any current mental health symptoms and denied any current SI/HI. MOB listed FOB, FOB's family and MOB's family as supports in the event that she need them.   CSW provided education regarding the baby blues period vs. perinatal mood disorders, discussed treatment and gave resources for mental health follow up if concerns arise.  CSW recommends self-evaluation during the postpartum time period using the New Mom Checklist from Postpartum Progress and encouraged MOB to contact a medical professional if symptoms are noted at any time.    CSW provided review of Sudden Infant Death Syndrome (SIDS) precautions.    MOB denied any further concerns or questions for CSW at this time. CSW identifies no further need for intervention and no barriers to discharge at this time.  Deloyd Handy [place], LCSWA  [facility] [phone]  

## 2018-12-29 NOTE — Lactation Note (Signed)
This note was copied from a baby's chart. Lactation Consultation Note Baby has been spitting, not having interest in BF. Baby in cross cradle position when LC entered rm holding nipple in mouth not suckling. Stimulation to suckle,  Unlatched, re-latched, little suckle occasionally. Repositioned in cradle. Baby sucked a few times then sleep. Swaddled baby. Gave mom DEBP that RN set up and started pumping. Mom's Rt. Nipple is large bifurcated w/skin tag.  Cussed pumping while baby isn't interested in BF. Encouraged to try to stimulate to feed every 3 hrs and w/cues. Alert RN if baby cont. To have no interest in feeding.  Mom states she knows how to hand express and give colostrum.  Patient Name: Anna Hines HUDJS'H Date: 12/29/2018 Reason for consult: Follow-up assessment;Difficult latch   Maternal Data    Feeding Feeding Type: Breast Fed  LATCH Score Latch: Repeated attempts needed to sustain latch, nipple held in mouth throughout feeding, stimulation needed to elicit sucking reflex.  Audible Swallowing: None  Type of Nipple: Everted at rest and after stimulation  Comfort (Breast/Nipple): Soft / non-tender  Hold (Positioning): Full assist, staff holds infant at breast  LATCH Score: 5  Interventions Interventions: Breast feeding basics reviewed;Adjust position;DEBP;Assisted with latch;Support pillows;Skin to skin;Position options;Breast massage;Hand express;Breast compression  Lactation Tools Discussed/Used Tools: Pump Breast pump type: Double-Electric Breast Pump Pump Review: Setup, frequency, and cleaning;Milk Storage Initiated by:: RN Date initiated:: 12/29/18   Consult Status Consult Status: Follow-up Date: 12/29/18 Follow-up type: In-patient    Charyl Dancer 12/29/2018, 6:46 AM

## 2018-12-29 NOTE — Lactation Note (Signed)
This note was copied from a baby's chart. Lactation Consultation Note  Patient Name: Anna Hines [id number]'Q Date: 12/29/2018   [facility] Follow Up Visit:  Mother sleeping; will return later today.  RN updated and RN has assisted mother with breast feeding today.      [name] 12/29/2018, 2:27 PM

## 2018-12-30 MED ORDER — FERROUS SULFATE 325 (65 FE) MG PO TABS: 325.0000 mg | ORAL_TABLET | ORAL | 0 refills | Status: DC

## 2018-12-30 MED ORDER — IBUPROFEN 600 MG PO TABS: 600.0000 mg | ORAL_TABLET | Freq: Four times a day (QID) | ORAL | 0 refills | Status: DC

## 2018-12-30 MED ORDER — ACETAMINOPHEN 325 MG PO TABS: 650.0000 mg | ORAL_TABLET | ORAL | 0 refills | Status: DC | PRN

## 2018-12-30 MED ORDER — ENALAPRIL MALEATE 10 MG PO TABS: 10.0000 mg | ORAL_TABLET | Freq: Every day | ORAL | 0 refills | Status: DC

## 2018-12-30 MED ORDER — OXYCODONE-ACETAMINOPHEN 5-325 MG PO TABS: 1.0000 | ORAL_TABLET | ORAL | 0 refills | Status: DC | PRN

## 2018-12-30 MED ORDER — FERROUS SULFATE 325 (65 FE) MG PO TABS: 325.0000 mg | ORAL_TABLET | ORAL | 3 refills | Status: DC

## 2018-12-30 MED ORDER — ENALAPRIL MALEATE 5 MG PO TABS: 5.0000 mg | ORAL_TABLET | Freq: Every day | ORAL | 0 refills | Status: DC

## 2018-12-30 MED ORDER — IBUPROFEN 800 MG PO TABS: 800.0000 mg | ORAL_TABLET | Freq: Three times a day (TID) | ORAL | 0 refills | Status: DC

## 2018-12-30 NOTE — Lactation Note (Signed)
This note was copied from a baby's chart. Lactation Consultation Note  Patient Name: Anna Hines [name] Date: 12/30/2018   Mom states she desires to bf but has not attempted since yesterday morning; dad thinks she tried last night but mom can't remember.    Mom pumped one time today and "did not get anything out".  She has been syringe and 5 fr tube feeding formula to infant by finger feeding.    LC discussed with mom her goals; mom desires to breastfeed.  LC offered mom the option to stay today in the hospital and breastfeed, so she can get assistance with latching and positioning.    LC let mom know the supplementation volume would be increasing after discharge and she would need to give infant formula from a bottle instead of finger feeding.  Mom and dad would need to give a bottle prior to discharge to make sure there are no issues with bottle feeding and to answer questions family may have regarding feeding.  Mom asked LC for some time to consider what it is she would like to do.   Information was given for scheduling [facility] outpatient appointment. LC offered to assist with this; mom will decide. Information was provided with phone number to gift shop and rate at which they rent breast pumps per month.  Mom will call out and let RN know if she wishes to stay and work on latching infant and breastfeeding, or if she would like to give the infant a bottle of formula prior to discharge so bottle feeding can be established due to increased volume intake after DC.       Maternal Data    Feeding Feeding Type: Formula  LATCH Score                   Interventions    Lactation Tools Discussed/Used     Consult Status      Maryruth Hancock [facility] 12/30/2018, 12:21 PM

## 2018-12-30 NOTE — Progress Notes (Signed)
Post Partum Day 1  Subjective: no complaints, up ad lib, voiding and tolerating PO no HA, RUQ pain or blurry vision, She is having decreasing Loquia today, no SOB, LE pain or swelling.   Objective: Blood pressure (!) 131/95, pulse 79, temperature 97.8 F (36.6 C), temperature source Oral, resp. rate 18, height 5\' 5"  (1.651 m), weight 90.3 kg, last menstrual period 04/10/2018, SpO2 98 %, unknown if currently breastfeeding.  Physical Exam:  General: alert, cooperative and no distress Lochia: appropriate Uterine Fundus: firm DVT Evaluation: No cords or calf tenderness. No significant calf/ankle edema.  Recent Labs    12/29/18 0621  HGB 8.1*  HCT 25.9*    Assessment/Plan: Discharge home   -PPH Hgb 8.1 asymptomatic on PO iron -Enalapril 10mg  QD Until BP check  -Undecided BC  -PP check in 1 week     LOS: 4 days   [name] 12/30/2018, 7:51 AM

## 2019-01-04 ENCOUNTER — Encounter: Payer: Self-pay | Admitting: Obstetrics & Gynecology

## 2019-01-04 ENCOUNTER — Ambulatory Visit (INDEPENDENT_AMBULATORY_CARE_PROVIDER_SITE_OTHER): Payer: BLUE CROSS/BLUE SHIELD | Admitting: Obstetrics & Gynecology

## 2019-01-04 ENCOUNTER — Other Ambulatory Visit: Payer: Self-pay

## 2019-01-04 VITALS — BP 128/88 | HR 97 | Resp 16 | Ht 65.0 in | Wt 178.0 lb

## 2019-01-04 DIAGNOSIS — Z0130 Encounter for examination of blood pressure without abnormal findings: Secondary | ICD-10-CM

## 2019-01-04 NOTE — Progress Notes (Signed)
   Subjective:    Patient ID: Anna Hines, female    DOB: 06/28/1981, 38 y.o.   MRN: [id number]  HPI 38 yo P1 here for a BP check. She is taking norvasc 10 mg per day. She tells me that she has had a "clogged ear" since prior to delivery. She has tried some OTC ear drops. She has not tried sudafed She has had a headache for several days. She has tried IBU 600 mg sporadically. She is breastfeeding.  Review of Systems     Objective:   Physical Exam Breathing, conversing, and ambulating normally Well nourished, well hydrated White female, no apparent distress DTRs- 1+     Assessment & Plan:  Clogged ear- rec sudafed HTN- continue 10 mg norvasc Rec check BP next week and send mychart message here with results and we will manage it through messages. Headache- rec IBU RTC q 6-8 hours She is somewhat emotional but doesn't feel like she needs meds She is considering an interval BTL (bilateral salpingectomy). Her husband declines a vasectomy but she is certain that she doesn't want more kids.

## 2019-01-27 ENCOUNTER — Ambulatory Visit: Payer: BLUE CROSS/BLUE SHIELD | Admitting: Obstetrics and Gynecology

## 2019-02-01 ENCOUNTER — Ambulatory Visit: Payer: BLUE CROSS/BLUE SHIELD | Admitting: Obstetrics & Gynecology

## 2019-02-02 ENCOUNTER — Encounter (INDEPENDENT_AMBULATORY_CARE_PROVIDER_SITE_OTHER): Payer: BLUE CROSS/BLUE SHIELD | Admitting: Osteopathic Medicine

## 2019-02-02 DIAGNOSIS — R21 Rash and other nonspecific skin eruption: Secondary | ICD-10-CM

## 2019-02-04 MED ORDER — HYDROXYZINE HCL 50 MG PO TABS: 25.0000 mg | ORAL_TABLET | Freq: Three times a day (TID) | ORAL | 1 refills | Status: DC | PRN

## 2019-02-04 MED ORDER — PREDNISONE 10 MG (48) PO TBPK: ORAL_TABLET | Freq: Every day | ORAL | 0 refills | Status: DC

## 2019-02-04 NOTE — Telephone Encounter (Signed)
5 mins spent  

## 2019-02-08 ENCOUNTER — Encounter: Payer: Self-pay | Admitting: Obstetrics & Gynecology

## 2019-02-08 ENCOUNTER — Other Ambulatory Visit: Payer: Self-pay

## 2019-02-08 ENCOUNTER — Ambulatory Visit (INDEPENDENT_AMBULATORY_CARE_PROVIDER_SITE_OTHER): Payer: BLUE CROSS/BLUE SHIELD | Admitting: Obstetrics & Gynecology

## 2019-02-08 DIAGNOSIS — Z1389 Encounter for screening for other disorder: Secondary | ICD-10-CM | POA: Diagnosis not present

## 2019-02-08 DIAGNOSIS — Z3009 Encounter for other general counseling and advice on contraception: Secondary | ICD-10-CM

## 2019-02-08 MED ORDER — NORGESTREL-ETHINYL ESTRADIOL 0.3-30 MG-MCG PO TABS: 1.0000 | ORAL_TABLET | Freq: Every day | ORAL | 11 refills | Status: DC

## 2019-02-08 NOTE — Progress Notes (Signed)
Post Partum Exam   TELEHEALTH VIRTUAL GYNECOLOGY VISIT ENCOUNTER NOTE  I connected with Anna Hines on 02/08/19 at  1:30 PM EDT by telephone at home and verified that I am speaking with the correct person using two identifiers.   I discussed the limitations, risks, security and privacy concerns of performing an evaluation and management service by telephone and the availability of in person appointments. I also discussed with the patient that there may be a patient responsible charge related to this service. The patient expressed understanding and agreed to proceed.   History:       Past Medical History:  Diagnosis Date  . Cholestasis during pregnancy   . Hyperlipidemia    Past Surgical History:  Procedure Laterality Date  . WISDOM TOOTH EXTRACTION     The following portions of the patient's history were reviewed and updated as appropriate: allergies, current medications, past family history, past medical history, past social history, past surgical history and problem list.     Review of Systems:  Pertinent items noted in HPI and remainder of comprehensive ROS otherwise negative.  Physical Exam:   General:  Alert, oriented and cooperative.   Mental Status: Normal mood and affect perceived. Normal judgment and thought content.  Physical exam deferred due to nature of the encounter  Labs and Imaging No results found for this or any previous visit (from the past 336 hour(s)). No results found.    Assessment and Plan:            I discussed the assessment and treatment plan with the patient. The patient was provided an opportunity to ask questions and all were answered. The patient agreed with the plan and demonstrated an understanding of the instructions.   The patient was advised to call back or seek an in-person evaluation/go to the ED if the symptoms worsen or if the condition fails to improve as anticipated.  I provided 10 minutes of non-face-to-face time during  this encounter.   Allie Bossier, MD [facility] Anna Hines is a 38 y.o. G60P1001 female who presents for a postpartum visit. She is 6 weeks postpartum following a spontaneous vaginal delivery. I have fully reviewed the prenatal and intrapartum course. The delivery was at 37 gestational weeks. She had IOL for cholestasis in pregnancy.  Anesthesia: epidural. Postpartum course has been unremarkable. Baby's course has been unremarkable. Baby is feeding by bottle - Similac Proadvance. Bleeding no bleeding. Bowel function is normal. Bladder function is normal. Patient is not sexually active. Contraception method is abstinence since delivery. She used OCP (estrogen/progesterone) in the past but conceived with the OCPs.  She started a LMP 3 days ago.  Postpartum depression screening:neg She has a BP cuff at home and reports that today's value is 106/80. She reports her weight to be 167 last week.   Last pap smear done about 4 years ago and she says that it was normal.   She wants a BTL but is unsure of her work schedule for June when her BTL is scheduled.   Review of Systems   Objective:  Last menstrual period 02/05/2019, not currently breastfeeding.     Plan:   1. Contraception: OCP (estrogen/progesterone)- Lo ovral - prescribed. Rec take 2 pills daily for the next 3 days and then daily. Rec back up for 4 weeks. Take BP at home Stop Norvasc today. Call for SBP greater than 140. I told her that if she still wants  a BTL, then she can message me when she would like it.  2. Annual exam in 4 months

## 2019-02-25 ENCOUNTER — Telehealth (INDEPENDENT_AMBULATORY_CARE_PROVIDER_SITE_OTHER): Payer: BLUE CROSS/BLUE SHIELD | Admitting: Physician Assistant

## 2019-02-25 ENCOUNTER — Encounter: Payer: Self-pay | Admitting: Osteopathic Medicine

## 2019-02-25 ENCOUNTER — Encounter: Payer: Self-pay | Admitting: Physician Assistant

## 2019-02-25 VITALS — BP 109/78 | HR 95 | Temp 98.3°F | Wt 177.0 lb

## 2019-02-25 DIAGNOSIS — L299 Pruritus, unspecified: Secondary | ICD-10-CM | POA: Diagnosis not present

## 2019-02-25 DIAGNOSIS — R21 Rash and other nonspecific skin eruption: Secondary | ICD-10-CM | POA: Diagnosis not present

## 2019-02-25 DIAGNOSIS — K831 Obstruction of bile duct: Secondary | ICD-10-CM | POA: Diagnosis not present

## 2019-02-25 MED ORDER — CHOLECALCIFEROL 125 MCG (5000 UT) PO CAPS: 5000.0000 [IU] | ORAL_CAPSULE | Freq: Every day | ORAL | 0 refills | Status: AC

## 2019-02-25 MED ORDER — MONTELUKAST SODIUM 10 MG PO TABS: 10.0000 mg | ORAL_TABLET | Freq: Every day | ORAL | 0 refills | Status: DC

## 2019-02-25 MED ORDER — PREDNISONE 50 MG PO TABS: ORAL_TABLET | ORAL | 0 refills | Status: DC

## 2019-02-25 MED ORDER — HYDROXYZINE PAMOATE 25 MG PO CAPS: 25.0000 mg | ORAL_CAPSULE | Freq: Three times a day (TID) | ORAL | 0 refills | Status: DC | PRN

## 2019-02-25 NOTE — Telephone Encounter (Signed)
Patient scheduled.

## 2019-02-25 NOTE — Progress Notes (Signed)
Virtual Visit via Video Note  I connected with Anna Hines on 02/25/19 at  3:00 PM EDT by a video enabled telemedicine application and verified that I am speaking with the correct person using two identifiers.   I discussed the limitations of evaluation and management by telemedicine and the availability of in person appointments. The patient expressed understanding and agreed to proceed.  History of Present Illness: HPI:                                                                Anna Hines is a 38 y.o. female   CC: rash  Onset: 1 month ago Location: arms and legs spread to chest, back and hips Duration Character: skin colored bumps that are very itchy and turn red with scratching Treatments tried: Prednisone - reports rash cleared up, but recurred shortly after completing taper; Hydroxyzine - minimally helpful, taking it every 8 hours  Recent illness / systemic symptoms:  History of cholestasis of pregnancy 8 weeks ago. States she was extremely itchy but did not have a rash at that time. Symptoms improved with delivery Medication / drug exposure: OCP Animal/insect exposure: dog at home History of allergies: cat dander, pollen Exposure to new soaps, perfumes, cleaning products: none Exposure to chemicals: none      Past Medical History:  Diagnosis Date  . Cholestasis during pregnancy   . Hyperlipidemia    Past Surgical History:  Procedure Laterality Date  . WISDOM TOOTH EXTRACTION     Social History   Tobacco Use  . Smoking status: Former Research scientist (life sciences)  . Smokeless tobacco: Never Used  . Tobacco comment: As per pt - having 3 cigs daily  Substance Use Topics  . Alcohol use: Not Currently    Alcohol/week: 0.0 standard drinks   family history includes Alcohol abuse in her maternal grandmother; Cancer in her maternal grandmother and mother; Heart attack in her paternal grandfather; Hyperlipidemia in her father and paternal grandmother; Stroke in her maternal  grandmother.    ROS: negative except as noted in the HPI  Medications: Current Outpatient Medications  Medication Sig Dispense Refill  . acetaminophen (TYLENOL) 325 MG tablet Take 2 tablets (650 mg total) by mouth every 4 (four) hours as needed for up to 30 doses (for pain scale < 4). 30 tablet 0  . cetirizine-pseudoephedrine (ZYRTEC-D ALLERGY & CONGESTION) 5-120 MG tablet Take 1 tablet by mouth 2 (two) times daily. 30 tablet 5  . hydrOXYzine (VISTARIL) 25 MG capsule Take 1 capsule (25 mg total) by mouth 3 (three) times daily as needed for itching. 30 capsule 0  . ibuprofen (ADVIL,MOTRIN) 600 MG tablet Take 600 mg by mouth every 6 (six) hours.    . norgestrel-ethinyl estradiol (LO/OVRAL) 0.3-30 MG-MCG tablet Take 1 tablet by mouth daily. 1 Package 11  . Prenatal Vit-Fe Fumarate-FA (PRENATAL MULTIVITAMIN) TABS tablet Take 1 tablet by mouth daily at 12 noon.    [name] albuterol (PROVENTIL HFA;VENTOLIN HFA) 108 (90 Base) MCG/ACT inhaler Inhale 1-2 puffs into the lungs every 6 (six) hours as needed for wheezing or shortness of breath. (Patient not taking: Reported on 02/25/2019) 1 Inhaler 0  . enalapril (VASOTEC) 10 MG tablet Take 10 mg by mouth daily.    . ferrous sulfate 325 (65 FE)  MG tablet Take 1 tablet (325 mg total) by mouth every other day. (Patient not taking: Reported on 02/08/2019) 30 tablet 0   No current facility-administered medications for this visit.    No Known Allergies     Objective:  BP 109/78   Pulse 95   Temp 98.3 F (36.8 C) (Oral)   Wt 177 lb (80.3 kg)   LMP 02/05/2019   BMI 29.45 kg/m  Gen:  alert, not ill-appearing, no distress, appropriate for age 14: head normocephalic without obvious abnormality, conjunctiva and cornea clear, trachea midline Pulm: Normal work of breathing, normal phonation, clear to auscultation bilaterally, no wheezes, rales or rhonchi CV: Normal rate, regular rhythm, s1 and s2 distinct, no murmurs, clicks or rubs  Neuro: alert and oriented  x 3, no tremor MSK: extremities atraumatic, normal gait and station Skin: intact, no rashes on exposed skin, no jaundice, no cyanosis Psych: well-groomed, cooperative, good eye contact, euthymic mood, affect mood-congruent, speech is articulate, and thought processes clear and goal-directed  Lab Results  Component Value Date   CREATININE 0.59 12/26/2018   BUN 8 12/26/2018   NA 136 12/26/2018   K 3.7 12/26/2018   CL 107 12/26/2018   CO2 22 12/26/2018   Lab Results  Component Value Date   ALT 10 12/26/2018   AST 19 12/26/2018   ALKPHOS 171 (H) 12/26/2018   BILITOT 0.4 12/26/2018   Lab Results  Component Value Date   WBC 14.7 (H) 12/29/2018   HGB 8.1 (L) 12/29/2018   HCT 25.9 (L) 12/29/2018   MCV 85.8 12/29/2018   PLT 170 12/29/2018      Assessment and Plan: 38 y.o. female with   .Diagnoses and all orders for this visit:  Pruritus -     Hepatic function panel -     Ferritin -     CBC -     BASIC METABOLIC PANEL WITH GFR  Cholestasis -     Hepatic function panel -     Ferritin -     CBC -     BASIC METABOLIC PANEL WITH GFR  Rash and nonspecific skin eruption -     montelukast (SINGULAIR) 10 MG tablet; Take 1 tablet (10 mg total) by mouth at bedtime for 14 days. -     hydrOXYzine (VISTARIL) 25 MG capsule; Take 1 capsule (25 mg total) by mouth 3 (three) times daily as needed for itching. -     predniSONE (DELTASONE) 50 MG tablet; One tab PO daily for 5 days. -     Cholecalciferol (D-3-5) 125 MCG (5000 UT) capsule; Take 1 capsule (5,000 Units total) by mouth daily for 14 days.   Leading differential is pruritus due to cholestasis with secondary neurodermatitis Recommended patient return for fasting labs If there are abnormalities of Alk Phos or liver enzymes will order RUQ [place] Urticaria and dermatitis are also on the differential Cont antihistamine Prednisone burst Start leukotriene antagonist and high dose vitamin D Follow-up in office in 2 weeks if symptoms  persist or recur    Follow Up Instructions:    I discussed the assessment and treatment plan with the patient. The patient was provided an opportunity to ask questions and all were answered. The patient agreed with the plan and demonstrated an understanding of the instructions.   The patient was advised to call back or seek an in-person evaluation if the symptoms worsen or if the condition fails to improve as anticipated.  I provided approx 15 minutes of non-face-to-face  time during this encounter.   Trixie Dredge, [place]

## 2019-03-05 DIAGNOSIS — K831 Obstruction of bile duct: Secondary | ICD-10-CM | POA: Diagnosis not present

## 2019-03-05 DIAGNOSIS — L299 Pruritus, unspecified: Secondary | ICD-10-CM | POA: Diagnosis not present

## 2019-03-05 LAB — HEPATIC FUNCTION PANEL
AG Ratio: 1.6 (calc) (ref 1.0–2.5)
ALT: 26 U/L (ref 6–29)
AST: 12 U/L (ref 10–30)
Albumin: 4.2 g/dL (ref 3.6–5.1)
Alkaline phosphatase (APISO): 72 U/L (ref 31–125)
Bilirubin, Direct: 0.1 mg/dL (ref 0.0–0.2)
Globulin: 2.7 g/dL (calc) (ref 1.9–3.7)
Indirect Bilirubin: 0.2 mg/dL (calc) (ref 0.2–1.2)
Total Bilirubin: 0.3 mg/dL (ref 0.2–1.2)
Total Protein: 6.9 g/dL (ref 6.1–8.1)

## 2019-03-05 LAB — CBC
HCT: 37.9 % (ref 35.0–45.0)
Hemoglobin: 12.2 g/dL (ref 11.7–15.5)
MCH: 27.2 pg (ref 27.0–33.0)
MCHC: 32.2 g/dL (ref 32.0–36.0)
MCV: 84.4 fL (ref 80.0–100.0)
MPV: 11 fL (ref 7.5–12.5)
Platelets: 377 10*3/uL (ref 140–400)
RBC: 4.49 10*6/uL (ref 3.80–5.10)
RDW: 15.6 % — ABNORMAL HIGH (ref 11.0–15.0)
WBC: 11.9 10*3/uL — ABNORMAL HIGH (ref 3.8–10.8)

## 2019-03-05 LAB — BASIC METABOLIC PANEL WITH GFR
BUN: 15 mg/dL (ref 7–25)
CO2: 28 mmol/L (ref 20–32)
Calcium: 9.4 mg/dL (ref 8.6–10.2)
Chloride: 104 mmol/L (ref 98–110)
Creat: 0.73 mg/dL (ref 0.50–1.10)
GFR, Est African American: 123 mL/min/{1.73_m2} (ref >=60)
GFR, Est Non African American: 106 mL/min/{1.73_m2} (ref >=60)
Glucose, Bld: 81 mg/dL (ref 65–99)
Potassium: 4.2 mmol/L (ref 3.5–5.3)
Sodium: 140 mmol/L (ref 135–146)

## 2019-03-05 LAB — FERRITIN: Ferritin: 4 ng/mL — ABNORMAL LOW (ref 16–154)

## 2019-03-08 ENCOUNTER — Encounter: Payer: Self-pay | Admitting: Physician Assistant

## 2019-03-08 DIAGNOSIS — D72829 Elevated white blood cell count, unspecified: Secondary | ICD-10-CM

## 2019-03-08 DIAGNOSIS — R790 Abnormal level of blood mineral: Secondary | ICD-10-CM

## 2019-03-08 HISTORY — DX: Abnormal level of blood mineral: R79.0

## 2019-03-08 HISTORY — DX: Elevated white blood cell count, unspecified: D72.829

## 2019-03-11 ENCOUNTER — Encounter: Payer: Self-pay | Admitting: Osteopathic Medicine

## 2019-03-12 ENCOUNTER — Encounter: Payer: Self-pay | Admitting: Family Medicine

## 2019-03-12 ENCOUNTER — Ambulatory Visit (INDEPENDENT_AMBULATORY_CARE_PROVIDER_SITE_OTHER): Payer: BLUE CROSS/BLUE SHIELD | Admitting: Family Medicine

## 2019-03-12 VITALS — BP 112/68 | HR 89 | Ht 65.0 in | Wt 182.0 lb

## 2019-03-12 DIAGNOSIS — L299 Pruritus, unspecified: Secondary | ICD-10-CM

## 2019-03-12 DIAGNOSIS — R21 Rash and other nonspecific skin eruption: Secondary | ICD-10-CM

## 2019-03-12 DIAGNOSIS — L739 Follicular disorder, unspecified: Secondary | ICD-10-CM

## 2019-03-12 MED ORDER — PREDNISONE 10 MG (48) PO TBPK: ORAL_TABLET | Freq: Every day | ORAL | 0 refills | Status: DC

## 2019-03-12 MED ORDER — DOXYCYCLINE HYCLATE 100 MG PO TABS: 100.0000 mg | ORAL_TABLET | Freq: Two times a day (BID) | ORAL | 0 refills | Status: DC

## 2019-03-12 MED ORDER — TRIAMCINOLONE ACETONIDE 0.1 % EX CREA: 1.0000 "application " | TOPICAL_CREAM | Freq: Every day | CUTANEOUS | 0 refills | Status: DC | PRN

## 2019-03-12 NOTE — Progress Notes (Signed)
Acute Office Visit  Subjective:    Patient ID: Anna Hines, female    DOB: 1981-05-22, 38 y.o.   MRN: [id number]  Chief Complaint  Patient presents with  . Rash    HPI Patient is in today for rash x5 weeks she says initially it broke out on her left leg and then spread to her right leg.  It then eventually spread to her arms and legs but her torso seemed to be spared.  She had a visit with her PCP who recommended a trial of prednisone and that did seem to help completely she got complete relief of the rash.  But when she had been off of the prednisone for a little over 24 hours it just started to gradually come back.  She then had a virtual visit again with another provider in our office where she was given another round of prednisone as well as hydroxyzine for itching and a trial of Singulair.  She says again the prednisone helped significantly but now the rash is back.  Now it is mostly on her legs and her torso and back and not as much on her arms.  She says it is very itchy but tries not to scratch.  She does scrub in the shower with a loofah type scrub.  She denies any fevers or chills.  She says all of her lotions, detergents, soaps, etc. are all the same she says she really has not changed anything recently.  But she did try switching to her daughter's Dreft detergent temporarily to see if that made a difference but it did not so she went back to her original detergent.  Past Medical History:  Diagnosis Date  . Cholestasis during pregnancy   . Hyperlipidemia   . Leukocytosis 03/08/2019    Past Surgical History:  Procedure Laterality Date  . WISDOM TOOTH EXTRACTION      Family History  Problem Relation Age of Onset  . Cancer Mother        CERVICAL  . Hyperlipidemia Father   . Alcohol abuse Maternal Grandmother   . Cancer Maternal Grandmother        BREAST  . Stroke Maternal Grandmother   . Hyperlipidemia Paternal Grandmother   . Heart attack Paternal Grandfather      Social History   Socioeconomic History  . Marital status: Single    Spouse name: Not on file  . Number of children: Not on file  . Years of education: Not on file  . Highest education level: Not on file  Occupational History  . Not on file  Social Needs  . Financial resource strain: Not hard at all  . Food insecurity:    Worry: Never true    Inability: Never true  . Transportation needs:    Medical: No    Non-medical: Not on file  Tobacco Use  . Smoking status: Former Games developer  . Smokeless tobacco: Never Used  . Tobacco comment: As per pt - having 3 cigs daily  Substance and Sexual Activity  . Alcohol use: Not Currently    Alcohol/week: 0.0 standard drinks  . Drug use: Never  . Sexual activity: Yes    Birth control/protection: Pill  Lifestyle  . Physical activity:    Days per week: Not on file    Minutes per session: Not on file  . Stress: Not on file  Relationships  . Social connections:    Talks on phone: Not on file    Gets together:  Not on file    Attends religious service: Not on file    Active member of club or organization: Not on file    Attends meetings of clubs or organizations: Not on file    Relationship status: Not on file  . Intimate partner violence:    Fear of current or ex partner: No    Emotionally abused: No    Physically abused: No    Forced sexual activity: No  Other Topics Concern  . Not on file  Social History Narrative  . Not on file    Outpatient Medications Prior to Visit  Medication Sig Dispense Refill  . acetaminophen (TYLENOL) 325 MG tablet Take 2 tablets (650 mg total) by mouth every 4 (four) hours as needed for up to 30 doses (for pain scale < 4). 30 tablet 0  . ferrous sulfate 325 (65 FE) MG tablet Take 1 tablet (325 mg total) by mouth every other day. 30 tablet 0  . hydrOXYzine (VISTARIL) 25 MG capsule Take 1 capsule (25 mg total) by mouth 3 (three) times daily as needed for itching. 30 capsule 0  . ibuprofen (ADVIL,MOTRIN)  600 MG tablet Take 600 mg by mouth every 6 (six) hours.    . Multiple Vitamins-Minerals (MULTIVITAMIN ADULT PO) Take 1 tablet by mouth daily.    . norgestrel-ethinyl estradiol (LO/OVRAL) 0.3-30 MG-MCG tablet Take 1 tablet by mouth daily. 1 Package 11  . montelukast (SINGULAIR) 10 MG tablet Take 1 tablet (10 mg total) by mouth at bedtime for 14 days. 30 tablet 0  . albuterol (PROVENTIL HFA;VENTOLIN HFA) 108 (90 Base) MCG/ACT inhaler Inhale 1-2 puffs into the lungs every 6 (six) hours as needed for wheezing or shortness of breath. (Patient not taking: Reported on 02/25/2019) 1 Inhaler 0  . cetirizine-pseudoephedrine (ZYRTEC-D ALLERGY & CONGESTION) 5-120 MG tablet Take 1 tablet by mouth 2 (two) times daily. 30 tablet 5  . predniSONE (DELTASONE) 50 MG tablet One tab PO daily for 5 days. 5 tablet 0  . Prenatal Vit-Fe Fumarate-FA (PRENATAL MULTIVITAMIN) TABS tablet Take 1 tablet by mouth daily at 12 noon.     No facility-administered medications prior to visit.     No Known Allergies  ROS     Objective:    Physical Exam  BP 112/68   Pulse 89   Ht 5\' 5"  (1.651 m)   Wt 182 lb (82.6 kg)   SpO2 100%   BMI 30.29 kg/m  Wt Readings from Last 3 Encounters:  03/12/19 182 lb (82.6 kg)  02/25/19 177 lb (80.3 kg)  01/04/19 178 lb (80.7 kg)    Health Maintenance Due  Topic Date Due  . PAP SMEAR-Modifier  06/19/2015    There are no preventive care reminders to display for this patient.   Lab Results  Component Value Date   TSH 1.65 04/11/2016   Lab Results  Component Value Date   WBC 11.9 (H) 03/05/2019   HGB 12.2 03/05/2019   HCT 37.9 03/05/2019   MCV 84.4 03/05/2019   PLT 377 03/05/2019   Lab Results  Component Value Date   NA 140 03/05/2019   K 4.2 03/05/2019   CO2 28 03/05/2019   GLUCOSE 81 03/05/2019   BUN 15 03/05/2019   CREATININE 0.73 03/05/2019   BILITOT 0.3 03/05/2019   ALKPHOS 171 (H) 12/26/2018   AST 12 03/05/2019   ALT 26 03/05/2019   PROT 6.9 03/05/2019    ALBUMIN 2.6 (L) 12/26/2018   CALCIUM 9.4 03/05/2019  ANIONGAP 7 12/26/2018   Lab Results  Component Value Date   CHOL 272 (H) 04/11/2016   Lab Results  Component Value Date   HDL 77 04/11/2016   Lab Results  Component Value Date   LDLCALC 180 (H) 04/11/2016   Lab Results  Component Value Date   TRIG 76 04/11/2016   Lab Results  Component Value Date   CHOLHDL 3.5 04/11/2016   No results found for: HGBA1C     Assessment & Plan:   Problem List Items Addressed This Visit      Musculoskeletal and Integument   Rash and nonspecific skin eruption   Relevant Orders   Ambulatory referral to Dermatology    Other Visit Diagnoses    Pruritus    -  Primary   Relevant Orders   Ambulatory referral to Dermatology   Folliculitis       Relevant Orders   Ambulatory referral to Dermatology     Rash honestly looks most consistent with folliculitis.  I wonder if it is coming from her loofah scrub her that she uses.  Encouraged her to stop using it and just use a washcloth it can be used for single use and washed in hot water.  Recommend diet free perfume free soaps detergents etc.  We will go ahead and treat with another round of oral prednisone.  This will be her third round but she responds really well to it.  Okay to discontinue the Singulair.  Can use the topical steroid cream if needed for small patches after she finishes the oral prednisone.  Also go ahead and place referral to dermatology for further work-up in case it does not resolve or in case it rebounds after she finishes the prednisone again.   Meds ordered this encounter  Medications  . predniSONE (STERAPRED UNI-PAK 48 TAB) 10 MG (48) TBPK tablet    Sig: Take by mouth daily. 12-Day taper, po    Dispense:  48 tablet    Refill:  0  . triamcinolone cream (KENALOG) 0.1 %    Sig: Apply 1 application topically daily as needed.    Dispense:  80 g    Refill:  0  . doxycycline (VIBRA-TABS) 100 MG tablet    Sig: Take 1  tablet (100 mg total) by mouth 2 (two) times daily.    Dispense:  14 tablet    Refill:  0     Nani Gasser, MD

## 2019-03-16 NOTE — Telephone Encounter (Signed)
Left message with information below and for patient to call us back to schedule an appointment or webex. °

## 2019-03-17 ENCOUNTER — Telehealth: Payer: Self-pay | Admitting: *Deleted

## 2019-03-17 NOTE — Telephone Encounter (Signed)
Left patient a second urgent voicemail to call the office to schedule pre op appointment with Dr. Marice Potter prior to surgery on 03/31/2019 or her surgery might be cancelled due to not having pre op appointment.

## 2019-03-30 DIAGNOSIS — L821 Other seborrheic keratosis: Secondary | ICD-10-CM | POA: Diagnosis not present

## 2019-03-30 DIAGNOSIS — L814 Other melanin hyperpigmentation: Secondary | ICD-10-CM | POA: Diagnosis not present

## 2019-03-30 DIAGNOSIS — D2262 Melanocytic nevi of left upper limb, including shoulder: Secondary | ICD-10-CM | POA: Diagnosis not present

## 2019-03-30 DIAGNOSIS — L579 Skin changes due to chronic exposure to nonionizing radiation, unspecified: Secondary | ICD-10-CM | POA: Diagnosis not present

## 2019-03-31 ENCOUNTER — Ambulatory Visit (HOSPITAL_BASED_OUTPATIENT_CLINIC_OR_DEPARTMENT_OTHER): Admit: 2019-03-31 | Payer: BLUE CROSS/BLUE SHIELD | Admitting: Obstetrics & Gynecology

## 2019-03-31 ENCOUNTER — Encounter (HOSPITAL_BASED_OUTPATIENT_CLINIC_OR_DEPARTMENT_OTHER): Payer: Self-pay

## 2019-03-31 ENCOUNTER — Other Ambulatory Visit: Payer: Self-pay | Admitting: *Deleted

## 2019-03-31 SURGERY — SALPINGECTOMY, BILATERAL, LAPAROSCOPIC
Anesthesia: Choice | Laterality: Bilateral

## 2019-03-31 MED ORDER — TRIAMCINOLONE ACETONIDE 0.1 % EX CREA: 1.0000 "application " | TOPICAL_CREAM | Freq: Every day | CUTANEOUS | 4 refills | Status: DC | PRN

## 2019-04-12 ENCOUNTER — Encounter: Payer: Self-pay | Admitting: Osteopathic Medicine

## 2019-04-13 ENCOUNTER — Ambulatory Visit: Payer: Self-pay | Admitting: Osteopathic Medicine

## 2019-04-13 DIAGNOSIS — B349 Viral infection, unspecified: Secondary | ICD-10-CM | POA: Diagnosis not present

## 2019-04-13 NOTE — Telephone Encounter (Signed)
Patient scheduled.

## 2019-05-21 ENCOUNTER — Encounter

## 2019-12-23 DIAGNOSIS — F329 Major depressive disorder, single episode, unspecified: Secondary | ICD-10-CM | POA: Diagnosis not present

## 2020-01-18 DIAGNOSIS — F10129 Alcohol abuse with intoxication, unspecified: Secondary | ICD-10-CM | POA: Diagnosis not present

## 2020-01-18 DIAGNOSIS — Y907 Blood alcohol level of 200-239 mg/100 ml: Secondary | ICD-10-CM | POA: Diagnosis not present

## 2020-01-18 DIAGNOSIS — Z793 Long term (current) use of hormonal contraceptives: Secondary | ICD-10-CM | POA: Diagnosis not present

## 2020-01-18 DIAGNOSIS — F10120 Alcohol abuse with intoxication, uncomplicated: Secondary | ICD-10-CM | POA: Diagnosis not present

## 2020-01-18 DIAGNOSIS — F17210 Nicotine dependence, cigarettes, uncomplicated: Secondary | ICD-10-CM | POA: Diagnosis not present

## 2020-01-18 DIAGNOSIS — F410 Panic disorder [episodic paroxysmal anxiety] without agoraphobia: Secondary | ICD-10-CM | POA: Diagnosis not present

## 2020-01-18 DIAGNOSIS — R112 Nausea with vomiting, unspecified: Secondary | ICD-10-CM | POA: Diagnosis not present

## 2020-01-18 DIAGNOSIS — E860 Dehydration: Secondary | ICD-10-CM | POA: Diagnosis not present

## 2020-01-18 DIAGNOSIS — Z79899 Other long term (current) drug therapy: Secondary | ICD-10-CM | POA: Diagnosis not present

## 2020-01-19 ENCOUNTER — Telehealth: Payer: Self-pay

## 2020-01-19 DIAGNOSIS — F419 Anxiety disorder, unspecified: Secondary | ICD-10-CM

## 2020-01-19 MED ORDER — ALPRAZOLAM 0.5 MG PO TABS: 0.2500 mg | ORAL_TABLET | Freq: Two times a day (BID) | ORAL | 0 refills | Status: DC | PRN

## 2020-01-19 NOTE — Telephone Encounter (Signed)
Refilled Xanax However, pt hasn't seen me in over a year (04/2018) No further refills without visit! Sounds like we need to touch base about her mental health.

## 2020-01-19 NOTE — Telephone Encounter (Signed)
Pt left a vm msg stating she continues to have severe panic attacks. As per pt, she used to get xanax in the past to help cope with the symptoms. Current hydroxyzine rx not working well. Requesting if provider can send in a rx into [facility] pharmacy. Pls advise, thanks.

## 2020-01-19 NOTE — Telephone Encounter (Signed)
Pt has been updated and aware of provider's request. Agreeable with provider's recommendation. Pt has been scheduled a virtual appt on 02/01/20 @ 710 am.

## 2020-01-19 NOTE — Telephone Encounter (Signed)
Please call patient and schedule for appt- refill sent but no more until appt is made

## 2020-01-20 ENCOUNTER — Ambulatory Visit: Payer: Self-pay | Admitting: Family Medicine

## 2020-01-20 ENCOUNTER — Other Ambulatory Visit: Payer: Self-pay

## 2020-01-20 ENCOUNTER — Encounter: Payer: Self-pay | Admitting: Nurse Practitioner

## 2020-01-20 ENCOUNTER — Ambulatory Visit (INDEPENDENT_AMBULATORY_CARE_PROVIDER_SITE_OTHER): Payer: BC Managed Care – PPO | Admitting: Nurse Practitioner

## 2020-01-20 VITALS — BP 107/76 | HR 116 | Temp 98.5°F | Ht 65.0 in | Wt 162.8 lb

## 2020-01-20 DIAGNOSIS — F411 Generalized anxiety disorder: Secondary | ICD-10-CM | POA: Diagnosis not present

## 2020-01-20 MED ORDER — ESCITALOPRAM OXALATE 10 MG PO TABS: ORAL_TABLET | ORAL | 0 refills | Status: DC

## 2020-01-20 NOTE — Progress Notes (Signed)
Acute Office Visit  Subjective:    Patient ID: Anna Hines, female    DOB: [birth year], 39 y.o.   MRN: [id number]  Chief Complaint  Patient presents with  . Anxiety    evaluated at [facility], panic attacks have been getting worse, anxiety has been building up over the last 1-2 months    HPI Anna Hines is in today for severe anxiety and panic attacks that have been going on for approximately the last month. She and her child's father split about one month ago and that triggered a relapse of anxiety symptoms that she had experienced in the past. She has been treated for anxiety in the past and was on Lexapro for a while prior to getting pregnant with her daughter. She stopped the Lexapro while pregnant and did very well off of the medication until the past month.   Ssince her symptoms have returned she has been drinking alcohol (wine) more frequently to help cope with her symptoms. She endorses this is not a healthy coping mechanism and she does not wish to continue drinking alcohol to cope.   On 01/18/20 she presented to the emergency room in a panic state. At that time they prescribed Ativan for her panic attacks and Zoloft 100 mg per day. She started the Zoloft yesterday morning and shortly thereafter went into a severe state of panic where she was unable to feel the left side of her body, unable to straighten her hands from a tight clench, and found it difficult to breath. She describes this as a "non-stop panic attack". Since that time her anxiety has significantly increased she is unable to control it with PRN xanax. She took a xanax earlier today to prepare for the appointment and then drank a glass of wine to be able to make it to the visit due to anxiety.    She has a good support system with friends that help with caring for her daughter. Her father lives in [place] and came to stay with her a few days this week, but left today. Her daughters father is not in her life much, but does have her  today while she is here. She is seeing a counselor that she started with last month. She is only seeing her monthly at this time. Her next appointment is schedule April 6.   She is interested in continuing medication, but is not sure sertraline is right for her. She is interested in seeking help today, possibly with an inpatient facility to help get her symptoms under control. She denies suicidal thoughts, but does not feel that she can go on much longer in the state of mind she is in. Driving is a significant anxiety trigger for her and going places causes extreme distress.   Past Medical History:  Diagnosis Date  . Cholestasis during pregnancy   . Hyperlipidemia   . Leukocytosis 03/08/2019    Past Surgical History:  Procedure Laterality Date  . WISDOM TOOTH EXTRACTION      Family History  Problem Relation Age of Onset  . Cancer Mother        CERVICAL  . Hyperlipidemia Father   . Alcohol abuse Maternal Grandmother   . Cancer Maternal Grandmother        BREAST  . Stroke Maternal Grandmother   . Hyperlipidemia Paternal Grandmother   . Heart attack Paternal Grandfather     Social History   Socioeconomic History  . Marital status: Single    Spouse name: Not on file  .  Number of children: Not on file  . Years of education: Not on file  . Highest education level: Not on file  Occupational History  . Not on file  Tobacco Use  . Smoking status: Current Every Day Smoker    Packs/day: 0.25    Types: Cigarettes  . Smokeless tobacco: Never Used  . Tobacco comment: As per pt - having 3 cigs daily  Substance and Sexual Activity  . Alcohol use: Yes    Alcohol/week: 30.0 standard drinks    Types: 30 Standard drinks or equivalent per week    Comment: wine and beer mixed  . Drug use: Never  . Sexual activity: Not Currently  Other Topics Concern  . Not on file  Social History Narrative  . Not on file   Social Determinants of Health   Financial Resource Strain:   . Difficulty  of Paying Living Expenses:   Food Insecurity:   . Worried About Programme researcher, broadcasting/film/video in the Last Year:   . Barista in the Last Year:   Transportation Needs:   . Freight forwarder (Medical):   [name] Lack of Transportation (Non-Medical):   Physical Activity:   . Days of Exercise per Week:   . Minutes of Exercise per Session:   Stress:   . Feeling of Stress :   Social Connections:   . Frequency of Communication with Friends and Family:   . Frequency of Social Gatherings with Friends and Family:   . Attends Religious Services:   . Active Member of Clubs or Organizations:   . Attends Banker Meetings:   [name] Marital Status:   Intimate Partner Violence:   . Fear of Current or Ex-Partner:   . Emotionally Abused:   [name] Physically Abused:   . Sexually Abused:     Outpatient Medications Prior to Visit  Medication Sig Dispense Refill  . ALPRAZolam (XANAX) 0.5 MG tablet Take 0.5-1 tablets (0.25-0.5 mg total) by mouth 2 (two) times daily as needed for anxiety (Use sparingly to avoid tolerance/dependence). #15 for ninety days 15 tablet 0  . LORazepam (ATIVAN) 1 MG tablet Take 1 mg by mouth daily as needed.    . Multiple Vitamins-Minerals (MULTIVITAMIN ADULT PO) Take 1 tablet by mouth daily.    . ondansetron (ZOFRAN-ODT) 4 MG disintegrating tablet Take 4 mg by mouth every 8 (eight) hours as needed.    . sertraline (ZOLOFT) 100 MG tablet Take 100 mg by mouth daily.    [name] acetaminophen (TYLENOL) 325 MG tablet Take 2 tablets (650 mg total) by mouth every 4 (four) hours as needed for up to 30 doses (for pain scale < 4). (Patient not taking: Reported on 01/20/2020) 30 tablet 0  . doxycycline (VIBRA-TABS) 100 MG tablet Take 1 tablet (100 mg total) by mouth 2 (two) times daily. (Patient not taking: Reported on 01/20/2020) 14 tablet 0  . ferrous sulfate 325 (65 FE) MG tablet Take 1 tablet (325 mg total) by mouth every other day. (Patient not taking: Reported on 01/20/2020) 30 tablet 0  .  hydrOXYzine (VISTARIL) 25 MG capsule Take 1 capsule (25 mg total) by mouth 3 (three) times daily as needed for itching. (Patient not taking: Reported on 01/20/2020) 30 capsule 0  . ibuprofen (ADVIL,MOTRIN) 600 MG tablet Take 600 mg by mouth every 6 (six) hours.    . norgestrel-ethinyl estradiol (LO/OVRAL) 0.3-30 MG-MCG tablet Take 1 tablet by mouth daily. (Patient not taking: Reported on 01/20/2020) 1 Package 11  .  predniSONE (STERAPRED UNI-PAK 48 TAB) 10 MG (48) TBPK tablet Take by mouth daily. 12-Day taper, po (Patient not taking: Reported on 01/20/2020) 48 tablet 0  . triamcinolone cream (KENALOG) 0.1 % Apply 1 application topically daily as needed. (Patient not taking: Reported on 01/20/2020) 80 g 4   No facility-administered medications prior to visit.    No Known Allergies  Review of Systems  Constitutional: Positive for fatigue. Negative for chills and fever.  Respiratory: Positive for chest tightness and shortness of breath.   Cardiovascular: Positive for palpitations.  Gastrointestinal: Positive for nausea and vomiting.  Neurological: Positive for dizziness, weakness and light-headedness.  Psychiatric/Behavioral: Positive for agitation, decreased concentration, dysphoric mood and sleep disturbance. The patient is nervous/anxious.       Objective:    Physical Exam Vitals and nursing note reviewed.  Constitutional:      General: She is in acute distress.  HENT:     Head: Normocephalic.  Eyes:     Extraocular Movements: Extraocular movements intact.     Conjunctiva/sclera: Conjunctivae normal.     Pupils: Pupils are equal, round, and reactive to light.  Cardiovascular:     Rate and Rhythm: Normal rate.  Pulmonary:     Effort: Pulmonary effort is normal.  Musculoskeletal:        General: Normal range of motion.     Cervical back: Normal range of motion.  Skin:    General: Skin is warm and dry.  Neurological:     Mental Status: She is alert and oriented to person, place, and  time.  Psychiatric:        Attention and Perception: Attention normal.        Mood and Affect: Mood is anxious and depressed. Affect is tearful.        Speech: Speech is delayed and slurred.        Behavior: Behavior is cooperative.        Thought Content: Thought content normal.        Cognition and Memory: Cognition normal.        Judgment: Judgment normal.    BP 107/76   Pulse (!) 116   Temp 98.5 F (36.9 C) (Oral)   Ht 5\' 5"  (1.651 m)   Wt 162 lb 12.8 oz (73.8 kg)   LMP 01/16/2020   SpO2 95%   BMI 27.09 kg/m  Wt Readings from Last 3 Encounters:  01/20/20 162 lb 12.8 oz (73.8 kg)  03/12/19 182 lb (82.6 kg)  02/25/19 177 lb (80.3 kg)    There are no preventive care reminders to display for this patient.  There are no preventive care reminders to display for this patient.   Lab Results  Component Value Date   TSH 1.65 04/11/2016   Lab Results  Component Value Date   WBC 11.9 (H) 03/05/2019   HGB 12.2 03/05/2019   HCT 37.9 03/05/2019   MCV 84.4 03/05/2019   PLT 377 03/05/2019   Lab Results  Component Value Date   NA 140 03/05/2019   K 4.2 03/05/2019   CO2 28 03/05/2019   GLUCOSE 81 03/05/2019   BUN 15 03/05/2019   CREATININE 0.73 03/05/2019   BILITOT 0.3 03/05/2019   ALKPHOS 171 (H) 12/26/2018   AST 12 03/05/2019   ALT 26 03/05/2019   PROT 6.9 03/05/2019   ALBUMIN 2.6 (L) 12/26/2018   CALCIUM 9.4 03/05/2019   ANIONGAP 7 12/26/2018   Lab Results  Component Value Date   CHOL 272 (H) 04/11/2016  Lab Results  Component Value Date   HDL 77 04/11/2016   Lab Results  Component Value Date   LDLCALC 180 (H) 04/11/2016   Lab Results  Component Value Date   TRIG 76 04/11/2016   Lab Results  Component Value Date   CHOLHDL 3.5 04/11/2016   No results found for: HGBA1C     Assessment & Plan:   1. Generalized anxiety disorder Symptoms and presentation consistent with anxiety disorder exacerbated by recent life events. At this time I do NOT feel  she is an immediate harm to herself or others, but I do feel that she would greatly benefit from inpatient admission or possibly a day program to help her establish with a medication regimen and psychiatric services.  Discussed local options with the patient for crisis evaluation including [facility] and [facility] in [place]. She was provided with addresses and telephone numbers to both locations and encouraged to reach out to one of them today to see if she is a candidate for inpatient admission or would qualify for outpatient services.  Patient is to STOP daily citalopram IMMEDIATELY and not take any more of the 100mg  dose.  Prescription provided for Lexapro 10mg  tablets with instructions to increase to 20mg  in 7 days. She has a current prescription for xanax from her PCP- will not provide additional benzodiazepines at this time, as I feel this would increase the patients risk of misuse or dependence.  Patient to follow-up in 2 weeks with in person or virtual visit with me or her PCP for her mood.  - escitalopram (LEXAPRO) 10 MG tablet; Take 1 tab (10 mg) by mouth daily for one week then increase to 2 tabs (20 mg) daily.  Dispense: 30 tablet; Refill: 0  Return in about 2 weeks (around 02/03/2020) for Mood.   , NP

## 2020-01-20 NOTE — Patient Instructions (Addendum)
[facility] [address] [place], [place] [phone]   [facility] [address] [place], [place] [phone]   Generalized Anxiety Disorder, Adult Generalized anxiety disorder (GAD) is a mental health disorder. People with this condition constantly worry about everyday events. Unlike normal anxiety, worry related to GAD is not triggered by a specific event. These worries also do not fade or get better with time. GAD interferes with life functions, including relationships, work, and school. GAD can vary from mild to severe. People with severe GAD can have intense waves of anxiety with physical symptoms (panic attacks). What are the causes? The exact cause of GAD is not known. What increases the risk? This condition is more likely to develop in:  Women.  People who have a family history of anxiety disorders.  People who are very shy.  People who experience very stressful life events, such as the death of a loved one.  People who have a very stressful family environment. What are the signs or symptoms? People with GAD often worry excessively about many things in their lives, such as their health and family. They may also be overly concerned about:  Doing well at work.  Being on time.  Natural disasters.  Friendships. Physical symptoms of GAD include:  Fatigue.  Muscle tension or having muscle twitches.  Trembling or feeling shaky.  Being easily startled.  Feeling like your heart is pounding or racing.  Feeling out of breath or like you cannot take a deep breath.  Having trouble falling asleep or staying asleep.  Sweating.  Nausea, diarrhea, or irritable bowel syndrome (IBS).  Headaches.  Trouble concentrating or remembering facts.  Restlessness.  Irritability. How is this diagnosed? Your health care provider can diagnose GAD based on your symptoms and medical history. You will also have a physical exam. The  health care provider will ask specific questions about your symptoms, including how severe they are, when they started, and if they come and go. Your health care provider may ask you about your use of alcohol or drugs, including prescription medicines. Your health care provider may refer you to a mental health specialist for further evaluation. Your health care provider will do a thorough examination and may perform additional tests to rule out other possible causes of your symptoms. To be diagnosed with GAD, a person must have anxiety that:  Is out of his or her control.  Affects several different aspects of his or her life, such as work and relationships.  Causes distress that makes him or her unable to take part in normal activities.  Includes at least three physical symptoms of GAD, such as restlessness, fatigue, trouble concentrating, irritability, muscle tension, or sleep problems. Before your health care provider can confirm a diagnosis of GAD, these symptoms must be present more days than they are not, and they must last for six months or longer. How is this treated? The following therapies are usually used to treat GAD:  Medicine. Antidepressant medicine is usually prescribed for long-term daily control. Antianxiety medicines may be added in severe cases, especially when panic attacks occur.  Talk therapy (psychotherapy). Certain types of talk therapy can be helpful in treating GAD by providing support, education, and guidance. Options include: ? Cognitive behavioral therapy (CBT). People learn coping skills and techniques to ease their anxiety. They learn to identify unrealistic or negative thoughts and behaviors and to replace them with positive ones. ? Acceptance and commitment therapy (ACT). This treatment teaches  people how to be mindful as a way to cope with unwanted thoughts and feelings. ? Biofeedback. This process trains you to manage your body's response (physiological  response) through breathing techniques and relaxation methods. You will work with a therapist while machines are used to monitor your physical symptoms.  Stress management techniques. These include yoga, meditation, and exercise. A mental health specialist can help determine which treatment is best for you. Some people see improvement with one type of therapy. However, other people require a combination of therapies. Follow these instructions at home:  Take over-the-counter and prescription medicines only as told by your health care provider.  Try to maintain a normal routine.  Try to anticipate stressful situations and allow extra time to manage them.  Practice any stress management or self-calming techniques as taught by your health care provider.  Do not punish yourself for setbacks or for not making progress.  Try to recognize your accomplishments, even if they are small.  Keep all follow-up visits as told by your health care provider. This is important. Contact a health care provider if:  Your symptoms do not get better.  Your symptoms get worse.  You have signs of depression, such as: ? A persistently sad, cranky, or irritable mood. ? Loss of enjoyment in activities that used to bring you joy. ? Change in weight or eating. ? Changes in sleeping habits. ? Avoiding friends or family members. ? Loss of energy for normal tasks. ? Feelings of guilt or worthlessness. Get help right away if:  You have serious thoughts about hurting yourself or others. If you ever feel like you may hurt yourself or others, or have thoughts about taking your own life, get help right away. You can go to your nearest emergency department or call:  Your local emergency services (911 in the [place]  A suicide crisis helpline, such as the National Suicide Prevention Lifeline at 201 073 6294. This is open 24 hours a day. Summary  Generalized anxiety disorder (GAD) is a mental health disorder that  involves worry that is not triggered by a specific event.  People with GAD often worry excessively about many things in their lives, such as their health and family.  GAD may cause physical symptoms such as restlessness, trouble concentrating, sleep problems, frequent sweating, nausea, diarrhea, headaches, and trembling or muscle twitching.  A mental health specialist can help determine which treatment is best for you. Some people see improvement with one type of therapy. However, other people require a combination of therapies. This information is not intended to replace advice given to you by your health care provider. Make sure you discuss any questions you have with your health care provider. Document Revised: 09/19/2017 Document Reviewed: 08/27/2016 Elsevier Patient Education  2020 Elsevier Inc.     Managing Anxiety, Adult After being diagnosed with an anxiety disorder, you may be relieved to know why you have felt or behaved a certain way. You may also feel overwhelmed about the treatment ahead and what it will mean for your life. With care and support, you can manage this condition and recover from it. How to manage lifestyle changes Managing stress and anxiety  Stress is your body's reaction to life changes and events, both good and bad. Most stress will last just a few hours, but stress can be ongoing and can lead to more than just stress. Although stress can play a major role in anxiety, it is not the same as anxiety. Stress is usually caused by something  external, such as a deadline, test, or competition. Stress normally passes after the triggering event has ended.  Anxiety is caused by something internal, such as imagining a terrible outcome or worrying that something will go wrong that will devastate you. Anxiety often does not go away even after the triggering event is over, and it can become long-term (chronic) worry. It is important to understand the differences between stress  and anxiety and to manage your stress effectively so that it does not lead to an anxious response. Talk with your health care provider or a counselor to learn more about reducing anxiety and stress. He or she may suggest tension reduction techniques, such as:  Music therapy. This can include creating or listening to music that you enjoy and that inspires you.  Mindfulness-based meditation. This involves being aware of your normal breaths while not trying to control your breathing. It can be done while sitting or walking.  Centering prayer. This involves focusing on a word, phrase, or sacred image that means something to you and brings you peace.  Deep breathing. To do this, expand your stomach and inhale slowly through your nose. Hold your breath for 3-5 seconds. Then exhale slowly, letting your stomach muscles relax.  Self-talk. This involves identifying thought patterns that lead to anxiety reactions and changing those patterns.  Muscle relaxation. This involves tensing muscles and then relaxing them. Choose a tension reduction technique that suits your lifestyle and personality. These techniques take time and practice. Set aside 5-15 minutes a day to do them. Therapists can offer counseling and training in these techniques. The training to help with anxiety may be covered by some insurance plans. Other things you can do to manage stress and anxiety include:  Keeping a stress/anxiety diary. This can help you learn what triggers your reaction and then learn ways to manage your response.  Thinking about how you react to certain situations. You may not be able to control everything, but you can control your response.  Making time for activities that help you relax and not feeling guilty about spending your time in this way.  Visual imagery and yoga can help you stay calm and relax.  Medicines Medicines can help ease symptoms. Medicines for anxiety include:  Anti-anxiety  drugs.  Antidepressants. Medicines are often used as a primary treatment for anxiety disorder. Medicines will be prescribed by a health care provider. When used together, medicines, psychotherapy, and tension reduction techniques may be the most effective treatment. Relationships Relationships can play a big part in helping you recover. Try to spend more time connecting with trusted friends and family members. Consider going to couples counseling, taking family education classes, or going to family therapy. Therapy can help you and others better understand your condition. How to recognize changes in your anxiety Everyone responds differently to treatment for anxiety. Recovery from anxiety happens when symptoms decrease and stop interfering with your daily activities at home or work. This may mean that you will start to:  Have better concentration and focus. Worry will interfere less in your daily thinking.  Sleep better.  Be less irritable.  Have more energy.  Have improved memory. It is important to recognize when your condition is getting worse. Contact your health care provider if your symptoms interfere with home or work and you feel like your condition is not improving. Follow these instructions at home: Activity  Exercise. Most adults should do the following: ? Exercise for at least 150 minutes each week. The exercise  should increase your heart rate and make you sweat (moderate-intensity exercise). ? Strengthening exercises at least twice a week.  Get the right amount and quality of sleep. Most adults need 7-9 hours of sleep each night. Lifestyle   Eat a healthy diet that includes plenty of vegetables, fruits, whole grains, low-fat dairy products, and lean protein. Do not eat a lot of foods that are high in solid fats, added sugars, or salt.  Make choices that simplify your life.  Do not use any products that contain nicotine or tobacco, such as cigarettes, e-cigarettes, and  chewing tobacco. If you need help quitting, ask your health care provider.  Avoid caffeine, alcohol, and certain over-the-counter cold medicines. These may make you feel worse. Ask your pharmacist which medicines to avoid. General instructions  Take over-the-counter and prescription medicines only as told by your health care provider.  Keep all follow-up visits as told by your health care provider. This is important. Where to find support You can get help and support from these sources:  Self-help groups.  Online and [employer].  A trusted spiritual leader.  Couples counseling.  Family education classes.  Family therapy. Where to find more information You may find that joining a support group helps you deal with your anxiety. The following sources can help you locate counselors or support groups near you:  [place]: www.mentalhealthamerica.net  Anxiety and Depression Association of [place] (ADAA): https://www.clark.net/  National Alliance on Mental Illness (NAMI): www.nami.org Contact a health care provider if you:  Have a hard time staying focused or finishing daily tasks.  Spend many hours a day feeling worried about everyday life.  Become exhausted by worry.  Start to have headaches, feel tense, or have nausea.  Urinate more than normal.  Have diarrhea. Get help right away if you have:  A racing heart and shortness of breath.  Thoughts of hurting yourself or others. If you ever feel like you may hurt yourself or others, or have thoughts about taking your own life, get help right away. You can go to your nearest emergency department or call:  Your local emergency services (911 in the [place]  A suicide crisis helpline, such as the [facility] at (608) 249-0475. This is open 24 hours a day. Summary  Taking steps to learn and use tension reduction techniques can help calm you and help prevent triggering an anxiety  reaction.  When used together, medicines, psychotherapy, and tension reduction techniques may be the most effective treatment.  Family, friends, and partners can play a big part in helping you recover from an anxiety disorder. This information is not intended to replace advice given to you by your health care provider. Make sure you discuss any questions you have with your health care provider. Document Revised: 03/09/2019 Document Reviewed: 03/09/2019 Elsevier Patient Education  [place].

## 2020-01-24 ENCOUNTER — Encounter: Payer: Self-pay | Admitting: Nurse Practitioner

## 2020-01-25 DIAGNOSIS — F329 Major depressive disorder, single episode, unspecified: Secondary | ICD-10-CM | POA: Diagnosis not present

## 2020-02-01 ENCOUNTER — Telehealth: Payer: Self-pay | Admitting: Osteopathic Medicine

## 2020-02-03 ENCOUNTER — Encounter: Payer: Self-pay | Admitting: Nurse Practitioner

## 2020-02-03 ENCOUNTER — Telehealth (INDEPENDENT_AMBULATORY_CARE_PROVIDER_SITE_OTHER): Payer: BC Managed Care – PPO | Admitting: Nurse Practitioner

## 2020-02-03 VITALS — BP 121/87 | Temp 98.4°F

## 2020-02-03 DIAGNOSIS — F411 Generalized anxiety disorder: Secondary | ICD-10-CM | POA: Diagnosis not present

## 2020-02-03 DIAGNOSIS — Z308 Encounter for other contraceptive management: Secondary | ICD-10-CM | POA: Diagnosis not present

## 2020-02-03 MED ORDER — ESCITALOPRAM OXALATE 20 MG PO TABS: ORAL_TABLET | ORAL | 2 refills | Status: DC

## 2020-02-03 MED ORDER — NORGESTREL-ETHINYL ESTRADIOL 0.3-30 MG-MCG PO TABS: 1.0000 | ORAL_TABLET | Freq: Every day | ORAL | 11 refills | Status: DC

## 2020-02-03 MED ORDER — ALPRAZOLAM 0.5 MG PO TABS: 0.2500 mg | ORAL_TABLET | Freq: Two times a day (BID) | ORAL | 0 refills | Status: DC | PRN

## 2020-02-03 NOTE — Patient Instructions (Signed)
I am SO SO proud of how much progress you have made in 2 short weeks! You are doing amazingly well! I see a sparkle in your eye that was not there before!  Please know that you can reach out to me if you have any questions or concerns. Unless you need me sooner, lets plan to meet in 6-8 weeks to touch base and make sure everything is going well and you aren't having any medication side effects.  I sent the prescriptions to the [facility] pharmacy so you can get them immediately. If you need me to change that to mail order let me know.

## 2020-02-03 NOTE — Progress Notes (Signed)
Virtual Visit via MyChart Note  I connected with  Anna Hines on 02/03/20 at  4:10 PM EDT by the video enabled telemedicine application, MyChart, and verified that I am speaking with the correct person using two identifiers.   I introduced myself as a Publishing rights manager with the practice. We discussed the limitations of evaluation and management by telemedicine and the availability of in person appointments. The patient expressed understanding and agreed to proceed.  The patient is: At home I am: In the office  Subjective:    CC: Follow-up for anxiety   HPI: Anna Hines is a 39 y.o. y/o female presenting via MyChart today for a 2-week follow-up for anxiety.  2 weeks ago Anna Hines presented with severe anxiety, depressive symptoms, and increased alcohol consumption after the recent ending of the relationship with her child's father.  She has been started on 100 mg of Zoloft while in the emergency room a few days before that visit.  She was in a complete panic state.  At that visit we changed her medication from Zoloft to Lexapro with a slow taper from 10 mg to 20 mg/day.  Anna Hines has now been on the Lexapro for approximately 2 weeks and she reports significant improvement in her symptoms.  She does report some fatigue with the medication when she wakes up in the morning however she reports that her anxiety symptoms have decreased significantly enough that she has not had to utilize her rescue medication for panic attacks nor has she had a panic attack at all this week.  She did meet with her therapist last week and learned some coping skills to help with panic and anxiety which she feels have been very useful and she has been utilizing.  She also found out this week through her employer she is eligible for additional counseling services which she plans to look into.  Her PHQ-9 and GAD-7 are much improved from her last visit and she is using alcohol much less. She denies current panic states,  increased alcohol consumption, palpitations, chest pain, sleep difficulties, and lack of motivation.  Past medical history, Surgical history, Family history not pertinant except as noted below, Social history, Allergies, and medications have been entered into the medical record, reviewed, and corrections made.   Review of Systems:  See HPI for pertinent positives negatives  Objective:    General: Speaking clearly in complete sentences without any shortness of breath.  Alert and oriented x3.  Normal judgment. No apparent acute distress. She is smiling today and interactive.  Impression and Recommendations:    1. Generalized anxiety disorder I am so proud of the significant improvement that Anna Hines has made in the past 2 weeks.  There has been a decrease in her alcohol consumption and the amount of benzodiazepines that she has had to use for sudden panic states and she is also utilizing skills that she has learned in therapy to help her on a day-to-day basis. Plan to continue Lexapro at 20 mg dose per day.  Additional prescription for as needed Xanax provided to be used very sparingly and only as needed for strong panic states. Plan to follow-up in 6 to 8 weeks with video visit for mood.  Patient instructed to contact me sooner if needed. - ALPRAZolam (XANAX) 0.5 MG tablet; Take 0.5-1 tablets (0.25-0.5 mg total) by mouth 2 (two) times daily as needed for anxiety (Use sparingly to avoid tolerance/dependence).  Dispense: 15 tablet; Refill: 0 - escitalopram (LEXAPRO) 20 MG tablet; Take  1 tab (20mg ) per day.  Dispense: 30 tablet; Refill: 2  2. Encounter for other contraceptive management Refill provided for oral contraceptive.  No concerns or questions at this time.  Patient is not currently sexually active and has not had a break in her medication use.  We will plan to do annual urine test at next in office visit. - norgestrel-ethinyl estradiol (LO/OVRAL) 0.3-30 MG-MCG tablet; Take 1 tablet by mouth  daily.  Dispense: 1 Package; Refill: 11      I discussed the assessment and treatment plan with the patient. The patient was provided an opportunity to ask questions and all were answered. The patient agreed with the plan and demonstrated an understanding of the instructions.   The patient was advised to call back or seek an in-person evaluation if the symptoms worsen or if the condition fails to improve as anticipated.  I provided 12 minutes of non-face-to-face interaction with this [place] visit.   Orma Render, NP

## 2020-03-15 ENCOUNTER — Encounter: Payer: Self-pay | Admitting: Nurse Practitioner

## 2020-03-17 NOTE — Telephone Encounter (Signed)
Appt scheduled

## 2020-04-01 IMAGING — US US MFM OB TRANSVAGINAL
1 series · 13 of 28 positions shown · non-contrast
Comparison: none

[Series 1: us mfm ob transvaginal · 84 acquisitions, 13 frames shown]
[im 4/84]
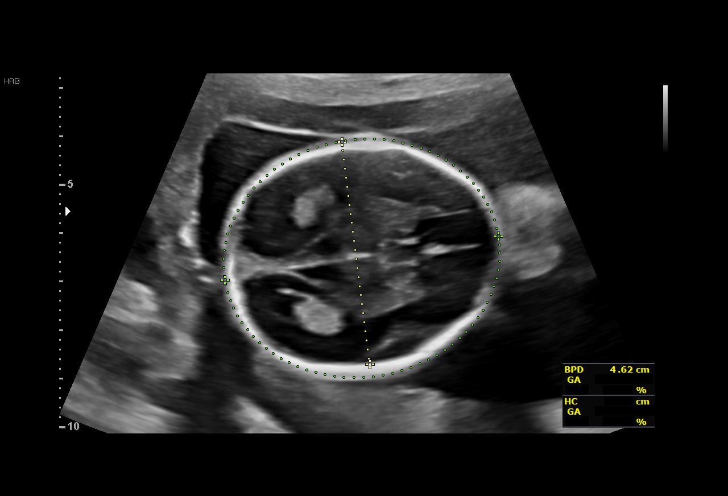
[im 10/84]
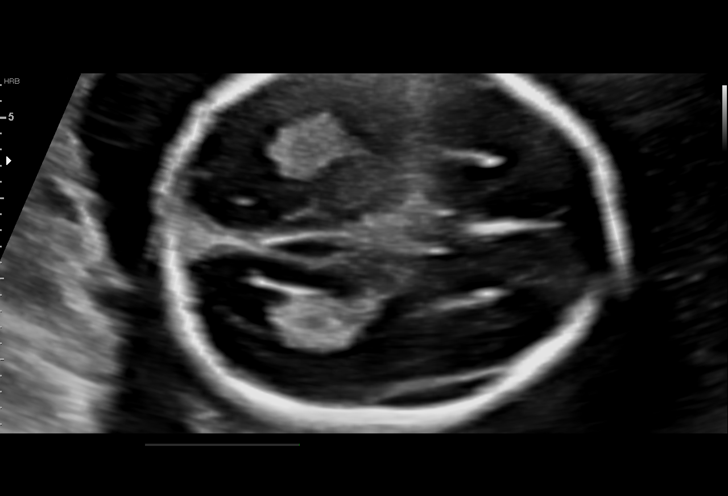
[im 16/84]
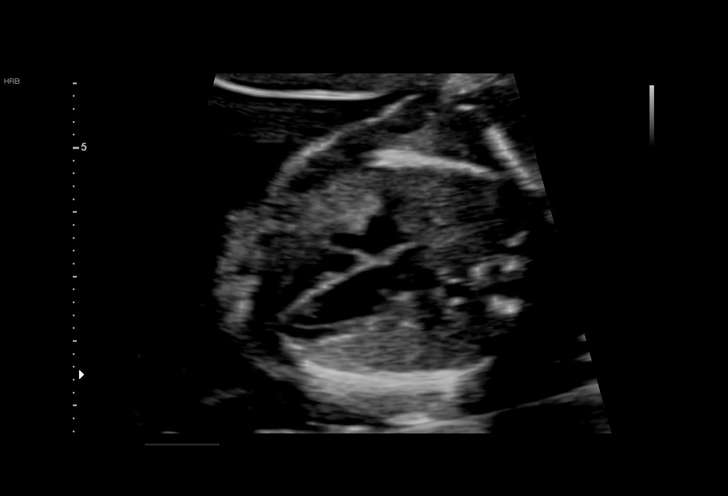
[im 22/84]
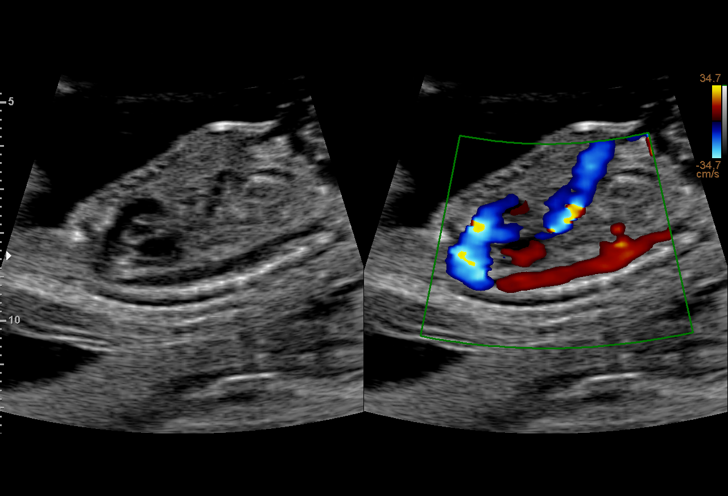
[im 28/84]
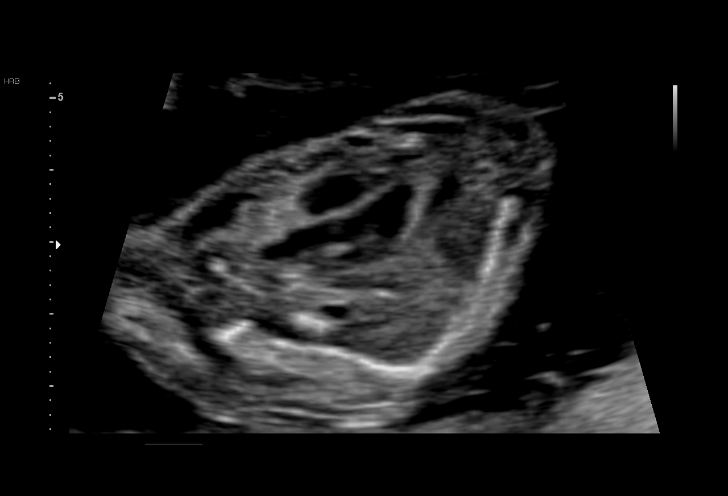
[im 34/84]
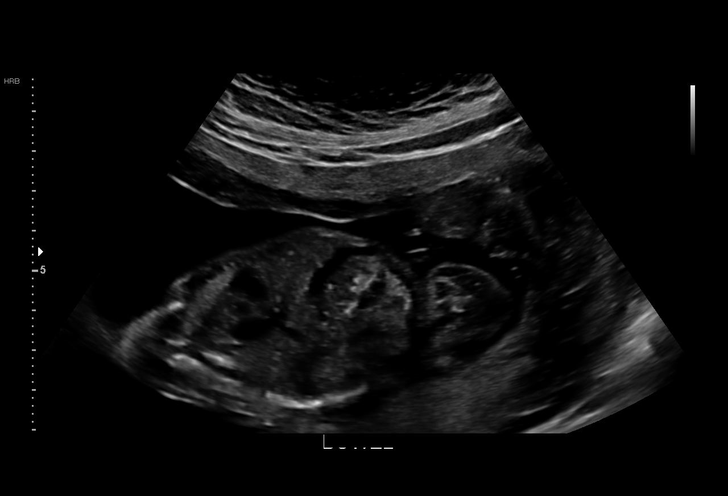
[im 44/84]
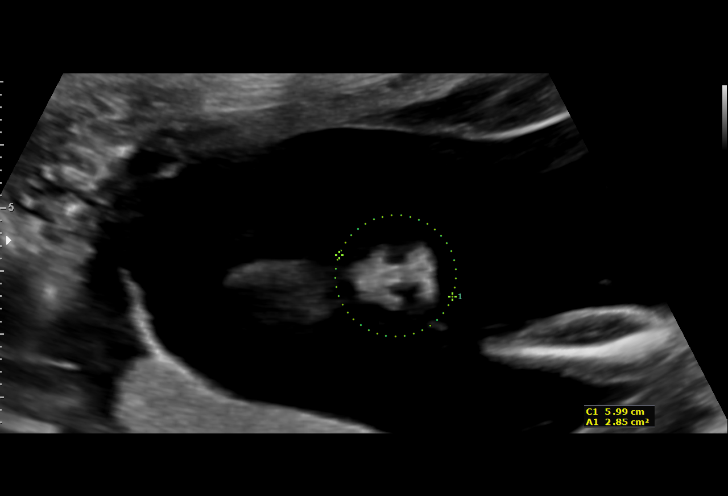
[im 50/84]
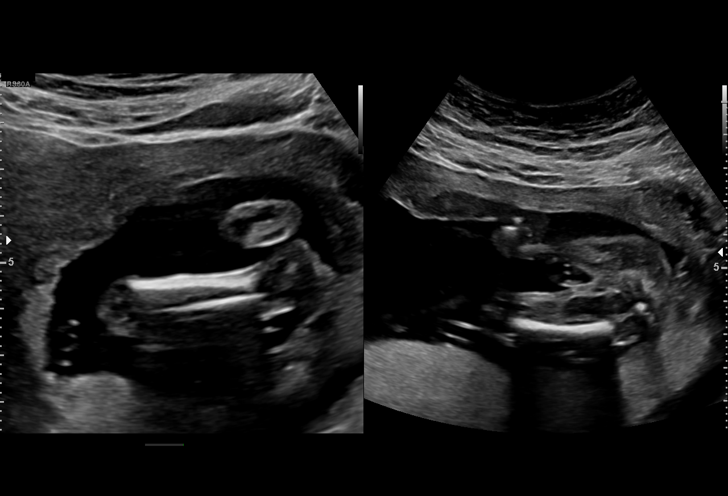
[im 56/84]
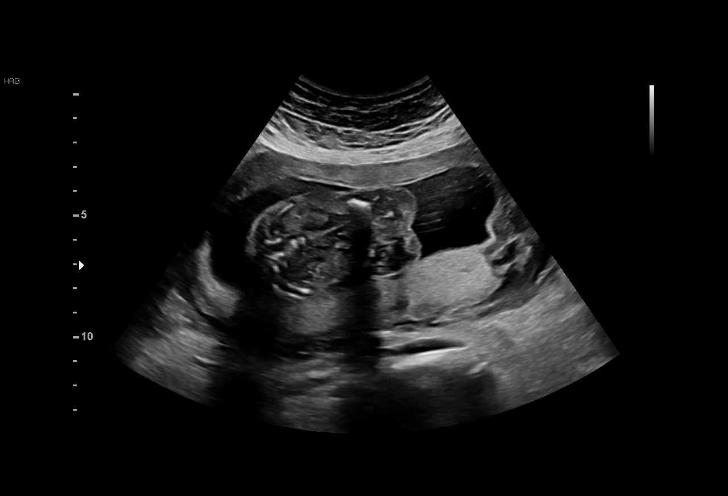
[im 62/84]
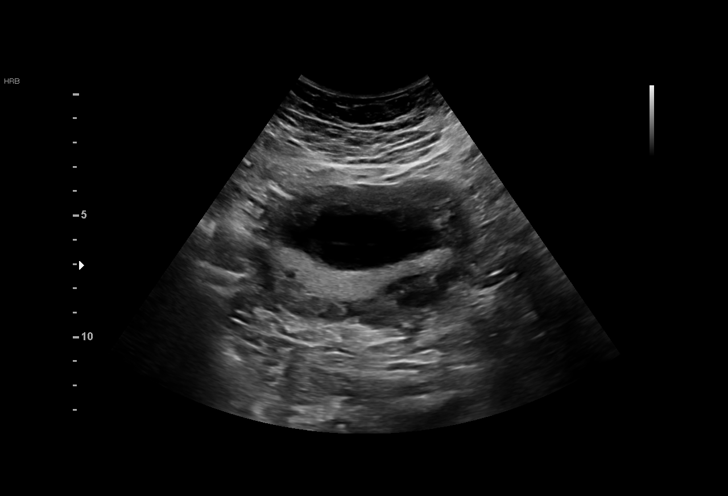
[im 68/84]
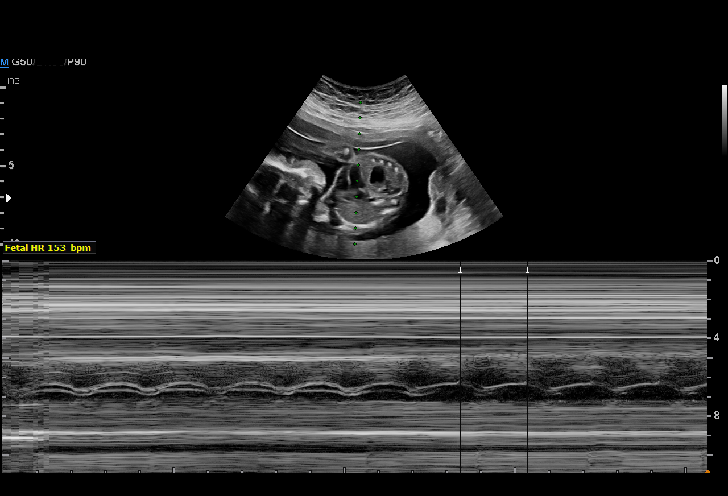
[im 74/84]
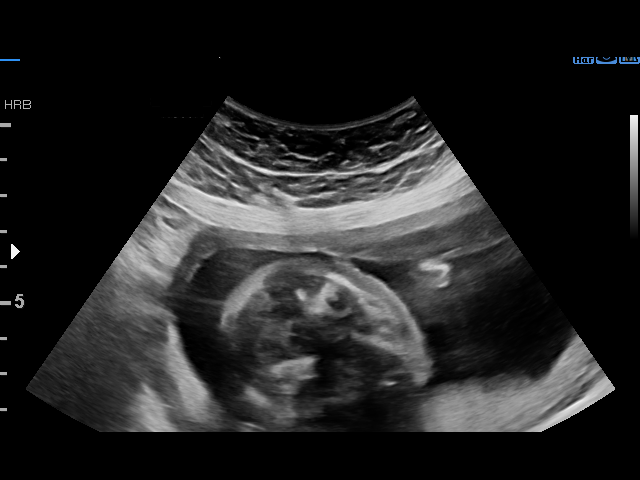
[im 80/84]
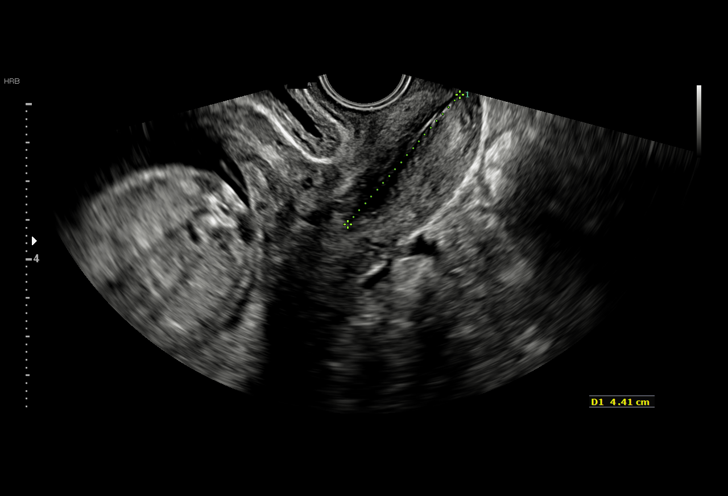

[13 of 28 positions shown; findings below may reference images not displayed]

2  US MFM OB TRANSVAGINAL               76817.2      ARIADNE ARENS
 ----------------------------------------------------------------------

 ----------------------------------------------------------------------
Indications

  Advanced maternal age primigravida 35+,
  second trimester (low risk NIPS)
  Encounter for antenatal screening for
  malformations
  19 weeks gestation of pregnancy
 ----------------------------------------------------------------------
Fetal Evaluation

 Num Of Fetuses:         1
 Fetal Heart Rate(bpm):  153
 Cardiac Activity:       Observed
 Presentation:           Breech
 Placenta:               Posterior
 P. Cord Insertion:      Visualized, central

 Amniotic Fluid
 AFI FV:      Within normal limits

                             Largest Pocket(cm)


 Comment:    Possible Moderate subchorionic hemorrhage noted.
Biometry

 BPD:      46.2  mm     G. Age:  20w 0d         55  %    CI:        75.66   %    70 - 86
                                                         FL/HC:      17.5   %    16.8 -
 HC:      168.4  mm     G. Age:  19w 4d         25  %    HC/AC:      1.01        1.09 -
 AC:      166.1  mm     G. Age:  21w 4d         91  %    FL/BPD:     63.6   %
 FL:       29.4  mm     G. Age:  19w 0d         18  %    FL/AC:      17.7   %    20 - 24
 HUM:      28.3  mm     G. Age:  19w 1d         34  %
 CER:      20.5  mm     G. Age:  19w 4d         42  %
 NFT:       4.7  mm

 LV:        5.4  mm
 CM:        2.3  mm

 Est. FW:     348  gm    0 lb 12 oz      56  %
OB History

 Gravidity:    1
Gestational Age

 LMP:           19w 6d        Date:  04/10/18                 EDD:   01/15/19
 U/S Today:     20w 0d                                        EDD:   01/14/19
 Best:          19w 6d     Det. By:  LMP  (04/10/18)          EDD:   01/15/19
Anatomy

 Cranium:               Appears normal         Aortic Arch:            Appears normal
 Cavum:                 Appears normal         Ductal Arch:            Appears normal
 Ventricles:            Appears normal         Diaphragm:              Appears normal
 Choroid Plexus:        Appears normal         Stomach:                Appears normal, left
                                                                       sided
 Cerebellum:            Appears normal         Abdomen:                Echogenic Bowel
 Posterior Fossa:       Appears normal         Abdominal Wall:         Appears nml (cord
                                                                       insert, abd wall)
 Nuchal Fold:           Appears normal         Cord Vessels:           Appears normal (3
                                                                       vessel cord)
 Face:                  Appears normal         Kidneys:                Appear normal
                        (orbits and profile)
 Lips:                  Appears normal         Bladder:                Appears normal
 Thoracic:              Appears normal         Spine:                  Appears normal
 Heart:                 Appears normal         Upper Extremities:      Appears normal
                        (4CH, axis, and situs
 RVOT:                  Appears normal         Lower Extremities:      Appears normal
 LVOT:                  Appears normal

 Other:  Female gender Heels and 5th digit visualized. Nasal bone visualized.
         Open hands visualized.
Cervix Uterus Adnexa

 Cervix
 Length:            4.4  cm.
 Measured transvaginally.

 Uterus
 No abnormality visualized.

 Left Ovary
 Not visualized. No adnexal mass visualized.

 Right Ovary
 Not visualized. No adnexal mass visualized.

 Cul De Sac
 No free fluid seen.
 Adnexa
 No abnormality visualized.
Impression

 We performed fetal anatomy scan. No makers of
 aneuploidies or fetal structural defects are seen. Fetal
 biometry is consistent with her previously-established dates.
 Amniotic fluid is normal and good fetal activity is seen.
 An ill-defined echodense area is seen near the inferior edge
 of the placenta that is consistent with hematoma. On
 transvaginal ultrasound, the cervix measures 4.4 cm, which is
 normal. I cannot rule out the possibility of resolving
 hematoma. Fetal bowel appeared slightly hyperechogenic. I
 informed the patient that she might have had some
 intraamniotic bleeding that can cause hyperechogenic bowel.

 On cell-free fetal DNA screening, the risks of fetal
 aneuploidies are not increased. MSAFP screening showed
 low risk for open-neural tube defects.
Recommendations

 Follow-up as clinically indicated.
                 [place], Shima

## 2020-04-17 ENCOUNTER — Other Ambulatory Visit: Payer: Self-pay | Admitting: Nurse Practitioner

## 2020-04-17 ENCOUNTER — Encounter: Payer: Self-pay | Admitting: Osteopathic Medicine

## 2020-04-17 DIAGNOSIS — F411 Generalized anxiety disorder: Secondary | ICD-10-CM

## 2020-04-17 MED ORDER — ESCITALOPRAM OXALATE 20 MG PO TABS: ORAL_TABLET | ORAL | 2 refills | Status: DC

## 2020-04-17 MED ORDER — ALPRAZOLAM 0.5 MG PO TABS: 0.2500 mg | ORAL_TABLET | Freq: Two times a day (BID) | ORAL | 1 refills | Status: DC | PRN

## 2020-05-16 ENCOUNTER — Telehealth (INDEPENDENT_AMBULATORY_CARE_PROVIDER_SITE_OTHER): Payer: BC Managed Care – PPO | Admitting: Osteopathic Medicine

## 2020-05-16 ENCOUNTER — Encounter: Payer: Self-pay | Admitting: Osteopathic Medicine

## 2020-05-16 VITALS — BP 104/91 | HR 128 | Wt 148.0 lb

## 2020-05-16 DIAGNOSIS — F411 Generalized anxiety disorder: Secondary | ICD-10-CM

## 2020-05-16 MED ORDER — ALPRAZOLAM 0.5 MG PO TABS: 0.2500 mg | ORAL_TABLET | Freq: Two times a day (BID) | ORAL | 1 refills | Status: DC | PRN

## 2020-05-16 MED ORDER — BUSPIRONE HCL 5 MG PO TABS
ORAL_TABLET | ORAL | 0 refills | Status: AC
Start: 2020-05-16 — End: 2020-06-06

## 2020-05-16 MED ORDER — ESCITALOPRAM OXALATE 20 MG PO TABS: ORAL_TABLET | ORAL | 3 refills | Status: DC

## 2020-05-16 NOTE — Progress Notes (Signed)
Virtual Visit via Video  I connected with      Anna Hines on 05/16/20 at 2:18 PM  by a telemedicine application and verified that I am speaking with the correct person using two identifiers.  Patient is in separate location from physician  I am in office   I discussed the limitations of evaluation and management by telemedicine and the availability of in person appointments. The patient expressed understanding and agreed to proceed.  History of Present Illness: Anna Hines is a 39 y.o. female who would like to discuss mental health   Doing okay on current regimen, still needing the alprazolam almost every day.       Depression screen [facility] 2/9 05/16/2020 02/03/2020 01/20/2020  Decreased Interest 0 3 2  Down, Depressed, Hopeless 0 1 2  PHQ - 2 Score 0 4 4  Altered sleeping 1 3 1   Tired, decreased energy 0 3 1  Change in appetite 0 2 1  Feeling bad or failure about yourself  0 2 2  Trouble concentrating 0 2 1  Moving slowly or fidgety/restless 0 1 1  Suicidal thoughts 0 0 1  PHQ-9 Score 1 17 12   Difficult doing work/chores Not difficult at all Somewhat difficult Somewhat difficult      Observations/Objective: BP (!) 104/91   Pulse (!) 128   Wt 148 lb (67.1 kg)   BMI 24.63 kg/m  BP Readings from Last 3 Encounters:  05/16/20 (!) 104/91  02/03/20 121/87  01/20/20 107/76   Exam: Normal Speech.  NAD  Lab and Radiology Results No results found for this or any previous visit (from the past 72 hour(s)). No results found.     Assessment and Plan: 39 y.o. female with The encounter diagnosis was Generalized anxiety disorder.  Will trial adding BuSpar to Lexapro and see if this helps GAD symptoms OK to stay on Alprazolam but goal is to taper off this Rx and use only sparingly maybe 1-2 times per week or even less   PDMP not reviewed this encounter. No orders of the defined types were placed in this encounter.  Meds ordered this encounter  Medications   . busPIRone (BUSPAR) 5 MG tablet    Sig: Take 1 tablet (5 mg total) by mouth 2 (two) times daily for 7 days, THEN 2 tablets (10 mg total) 2 (two) times daily for 7 days, THEN 3 tablets (15 mg total) 2 (two) times daily for 7 days.    Dispense:  90 tablet    Refill:  0  . escitalopram (LEXAPRO) 20 MG tablet    Sig: Take 1 tab (20mg ) per day.    Dispense:  90 tablet    Refill:  3  . ALPRAZolam (XANAX) 0.5 MG tablet    Sig: Take 0.5-1 tablets (0.25-0.5 mg total) by mouth 2 (two) times daily as needed for anxiety (Use sparingly to avoid tolerance/dependence).    Dispense:  15 tablet    Refill:  1    Maximum 15 tabs in 30 days.   There are no Patient Instructions on file for this visit.  Instructions sent via MyChart. If MyChart not available, pt was given option for info via personal e-mail w/ no guarantee of protected health info over unsecured e-mail communication, and MyChart sign-up instructions were sent to patient.   Follow Up Instructions: No follow-ups on file.    I discussed the assessment and treatment plan with the patient. The patient was provided an opportunity to  ask questions and all were answered. The patient agreed with the plan and demonstrated an understanding of the instructions.   The patient was advised to call back or seek an in-person evaluation if any new concerns, if symptoms worsen or if the condition fails to improve as anticipated.  30 minutes of non-face-to-face time was provided during this encounter.      . . . . . . . . . . . . . [name]                   Historical information moved to improve visibility of documentation.  Past Medical History:  Diagnosis Date  . Cholestasis during pregnancy   . Hyperlipidemia   . Leukocytosis 03/08/2019   Past Surgical History:  Procedure Laterality Date  . WISDOM TOOTH EXTRACTION     Social History   Tobacco Use  . Smoking status: Current Every Day Smoker    Packs/day: 0.25     Types: Cigarettes  . Smokeless tobacco: Never Used  . Tobacco comment: As per pt - having 3 cigs daily  Substance Use Topics  . Alcohol use: Yes    Alcohol/week: 30.0 standard drinks    Types: 30 Standard drinks or equivalent per week    Comment: wine and beer mixed   family history includes Alcohol abuse in her maternal grandmother; Cancer in her maternal grandmother and mother; Heart attack in her paternal grandfather; Hyperlipidemia in her father and paternal grandmother; Stroke in her maternal grandmother.  Medications: Current Outpatient Medications  Medication Sig Dispense Refill  . ALPRAZolam (XANAX) 0.5 MG tablet Take 0.5-1 tablets (0.25-0.5 mg total) by mouth 2 (two) times daily as needed for anxiety (Use sparingly to avoid tolerance/dependence). 15 tablet 1  . escitalopram (LEXAPRO) 20 MG tablet Take 1 tab (20mg ) per day. 90 tablet 3  . Multiple Vitamins-Minerals (MULTIVITAMIN ADULT PO) Take 1 tablet by mouth daily.    . norgestrel-ethinyl estradiol (LO/OVRAL) 0.3-30 MG-MCG tablet Take 1 tablet by mouth daily. 1 Package 11  . busPIRone (BUSPAR) 5 MG tablet Take 1 tablet (5 mg total) by mouth 2 (two) times daily for 7 days, THEN 2 tablets (10 mg total) 2 (two) times daily for 7 days, THEN 3 tablets (15 mg total) 2 (two) times daily for 7 days. 90 tablet 0   No current facility-administered medications for this visit.   No Known Allergies

## 2020-05-18 ENCOUNTER — Encounter: Payer: Self-pay | Admitting: Osteopathic Medicine

## 2020-05-22 NOTE — Telephone Encounter (Signed)
Patient stated she is feeling a little better now since this message & didn't want a virtual visit at the moment.... said if she begun to feel worse she would call and schedule the virtual visit. AM

## 2020-06-20 ENCOUNTER — Telehealth: Payer: BC Managed Care – PPO | Admitting: Osteopathic Medicine

## 2020-06-27 ENCOUNTER — Telehealth (INDEPENDENT_AMBULATORY_CARE_PROVIDER_SITE_OTHER): Payer: BC Managed Care – PPO | Admitting: Osteopathic Medicine

## 2020-06-27 VITALS — BP 136/100 | Temp 97.0°F | Wt 157.0 lb

## 2020-06-27 DIAGNOSIS — F411 Generalized anxiety disorder: Secondary | ICD-10-CM | POA: Diagnosis not present

## 2020-06-27 NOTE — Progress Notes (Signed)
Virtual Visit via Video (App used: Doxm[employer]) Note  I connected with      [name] on 06/27/20 at 10:41 AM  by a telemedicine application and verified that I am speaking with the correct person using two identifiers.  Patient is in separate location  I am in office   I discussed the limitations of evaluation and management by telemedicine and the availability of in person appointments. The patient expressed understanding and agreed to proceed.  History of Present Illness: Anna Hines is a 39 y.o. female who would like to discuss mental health follow-up    Increased Anxiety  Last visit we added BuSpar to lexapro, and advised tapering down on Xanax  Pt reports improvement today   Current Rx:  Lexapro 20 mg daily BuSpar 15 mg bid was started last month - has started this just recently and noting a positive difference with 5 mg bid.  Alprazolam 0.25-0.5 mg bid prn #15 for 30 days (tapering down to sparing use per instructions from last visit 05/16/20)     Depression screen [facility] 2/9 06/27/2020 05/16/2020 02/03/2020  Decreased Interest 0 0 3  Down, Depressed, Hopeless 0 0 1  PHQ - 2 Score 0 0 4  Altered sleeping 1 1 3   Tired, decreased energy 1 0 3  Change in appetite 0 0 2  Feeling bad or failure about yourself  0 0 2  Trouble concentrating 0 0 2  Moving slowly or fidgety/restless 0 0 1  Suicidal thoughts 0 0 0  PHQ-9 Score 2 1 17   Difficult doing work/chores Not difficult at all Not difficult at all Somewhat difficult   GAD 7 : Generalized Anxiety Score 06/27/2020 05/16/2020 02/03/2020 01/20/2020  Nervous, Anxious, on Edge 1 1 1 1   Control/stop worrying 0 0 1 1  Worry too much - different things 0 0 1 1  Trouble relaxing 0 0 1 2  Restless 0 0 1 2  Easily annoyed or irritable 0 0 0 0  Afraid - awful might happen 0 0 1 2  Total GAD 7 Score 1 1 6 9   Anxiety Difficulty Not difficult at all Not difficult at all Somewhat difficult Somewhat difficult         Observations/Objective: BP (!) 136/100   Temp (!) 97 F (36.1 C)   Wt 157 lb (71.2 kg)   BMI 26.13 kg/m  BP Readings from Last 3 Encounters:  06/27/20 (!) 136/100  05/16/20 (!) 104/91  02/03/20 121/87   Exam: Normal Speech.  NAD  Lab and Radiology Results No results found for this or any previous visit (from the past 72 hour(s)). No results found.     Assessment and Plan: 39 y.o. female with The encounter diagnosis was Generalized anxiety disorder.  Continue w/ BuSpar Can take 10 or 15 mg bid, pt can adjust as needed, she feels comfortable w/ this plan and we can recheck in 4 - 6 weeks to see how she's doing, let me know sooner if any questions/problems      Follow Up Instructions: Return for VIRTUAL VISIT FOLLOW UP MENTAL HEALTH .    I discussed the assessment and treatment plan with the patient. The patient was provided an opportunity to ask questions and all were answered. The patient agreed with the plan and demonstrated an understanding of the instructions.   The patient was advised to call back or seek an in-person evaluation if any new concerns, if symptoms worsen or if the condition  fails to improve as anticipated.  20 minutes of non-face-to-face time was provided during this encounter.      . . . . . . . . . . . . . [name]                   Historical information moved to improve visibility of documentation.  Past Medical History:  Diagnosis Date  . Cholestasis during pregnancy   . Hyperlipidemia   . Leukocytosis 03/08/2019   Past Surgical History:  Procedure Laterality Date  . WISDOM TOOTH EXTRACTION     Social History   Tobacco Use  . Smoking status: Current Every Day Smoker    Packs/day: 0.25    Types: Cigarettes  . Smokeless tobacco: Never Used  . Tobacco comment: As per pt - having 3 cigs daily  Substance Use Topics  . Alcohol use: Yes    Alcohol/week: 30.0 standard drinks    Types: 30 Standard  drinks or equivalent per week    Comment: wine and beer mixed   family history includes Alcohol abuse in her maternal grandmother; Cancer in her maternal grandmother and mother; Heart attack in her paternal grandfather; Hyperlipidemia in her father and paternal grandmother; Stroke in her maternal grandmother.  Medications: Current Outpatient Medications  Medication Sig Dispense Refill  . ALPRAZolam (XANAX) 0.5 MG tablet Take 0.5-1 tablets (0.25-0.5 mg total) by mouth 2 (two) times daily as needed for anxiety (Use sparingly to avoid tolerance/dependence). 15 tablet 1  . busPIRone (BUSPAR) 5 MG tablet Take 5 mg by mouth 3 (three) times daily.    [name] escitalopram (LEXAPRO) 20 MG tablet Take 1 tab (20mg ) per day. 90 tablet 3  . Multiple Vitamins-Minerals (MULTIVITAMIN ADULT PO) Take 1 tablet by mouth daily.    . norgestrel-ethinyl estradiol (LO/OVRAL) 0.3-30 MG-MCG tablet Take 1 tablet by mouth daily. 1 Package 11   No current facility-administered medications for this visit.   No Known Allergies

## 2020-08-01 ENCOUNTER — Telehealth (INDEPENDENT_AMBULATORY_CARE_PROVIDER_SITE_OTHER): Payer: Self-pay | Admitting: Osteopathic Medicine

## 2020-08-01 DIAGNOSIS — Z91199 Patient's noncompliance with other medical treatment and regimen due to unspecified reason: Secondary | ICD-10-CM

## 2020-08-01 DIAGNOSIS — Z5329 Procedure and treatment not carried out because of patient's decision for other reasons: Secondary | ICD-10-CM

## 2020-08-01 NOTE — Progress Notes (Signed)
Attempted to call patient to begin video visit with Dr. Lyn Hollingshead. 1st attempt 10:52 2nd attempted 10:50 am. Left message on voicemail letting patient know we were attempting to reach her.

## 2020-08-28 ENCOUNTER — Telehealth: Payer: Self-pay

## 2020-08-28 MED ORDER — BUSPIRONE HCL 5 MG PO TABS: 5.0000 mg | ORAL_TABLET | Freq: Three times a day (TID) | ORAL | 3 refills | Status: DC

## 2020-08-28 NOTE — Telephone Encounter (Signed)
[facility] is requesting 90 d/s for buspirone. Written by historical provider.

## 2021-02-01 DIAGNOSIS — J209 Acute bronchitis, unspecified: Secondary | ICD-10-CM | POA: Diagnosis not present

## 2021-02-01 DIAGNOSIS — Z20822 Contact with and (suspected) exposure to covid-19: Secondary | ICD-10-CM | POA: Diagnosis not present

## 2021-02-01 DIAGNOSIS — J019 Acute sinusitis, unspecified: Secondary | ICD-10-CM | POA: Diagnosis not present

## 2021-03-15 DIAGNOSIS — Z20822 Contact with and (suspected) exposure to covid-19: Secondary | ICD-10-CM | POA: Diagnosis not present

## 2021-03-15 DIAGNOSIS — R509 Fever, unspecified: Secondary | ICD-10-CM | POA: Diagnosis not present

## 2021-03-15 DIAGNOSIS — J329 Chronic sinusitis, unspecified: Secondary | ICD-10-CM | POA: Diagnosis not present

## 2021-03-15 DIAGNOSIS — H66002 Acute suppurative otitis media without spontaneous rupture of ear drum, left ear: Secondary | ICD-10-CM | POA: Diagnosis not present

## 2021-03-15 DIAGNOSIS — R059 Cough, unspecified: Secondary | ICD-10-CM | POA: Diagnosis not present

## 2021-03-15 DIAGNOSIS — J309 Allergic rhinitis, unspecified: Secondary | ICD-10-CM | POA: Diagnosis not present

## 2021-05-29 ENCOUNTER — Other Ambulatory Visit: Payer: Self-pay | Admitting: Osteopathic Medicine

## 2021-05-29 DIAGNOSIS — F411 Generalized anxiety disorder: Secondary | ICD-10-CM

## 2021-06-26 DIAGNOSIS — H9312 Tinnitus, left ear: Secondary | ICD-10-CM | POA: Diagnosis not present

## 2021-06-26 DIAGNOSIS — J358 Other chronic diseases of tonsils and adenoids: Secondary | ICD-10-CM | POA: Diagnosis not present

## 2021-06-26 DIAGNOSIS — H93293 Other abnormal auditory perceptions, bilateral: Secondary | ICD-10-CM | POA: Diagnosis not present

## 2021-07-05 ENCOUNTER — Other Ambulatory Visit: Payer: Self-pay | Admitting: Osteopathic Medicine

## 2021-07-05 DIAGNOSIS — F411 Generalized anxiety disorder: Secondary | ICD-10-CM

## 2021-07-05 MED ORDER — ESCITALOPRAM OXALATE 20 MG PO TABS: ORAL_TABLET | ORAL | 0 refills | Status: DC

## 2021-07-24 ENCOUNTER — Other Ambulatory Visit: Payer: Self-pay

## 2021-07-24 DIAGNOSIS — F411 Generalized anxiety disorder: Secondary | ICD-10-CM

## 2021-07-24 MED ORDER — ESCITALOPRAM OXALATE 20 MG PO TABS: ORAL_TABLET | ORAL | 0 refills | Status: DC

## 2021-08-02 DIAGNOSIS — J301 Allergic rhinitis due to pollen: Secondary | ICD-10-CM | POA: Diagnosis not present

## 2021-08-30 ENCOUNTER — Encounter: Payer: Self-pay | Admitting: Medical-Surgical

## 2021-08-30 ENCOUNTER — Other Ambulatory Visit: Payer: Self-pay

## 2021-08-30 ENCOUNTER — Ambulatory Visit (INDEPENDENT_AMBULATORY_CARE_PROVIDER_SITE_OTHER): Payer: BC Managed Care – PPO | Admitting: Medical-Surgical

## 2021-08-30 VITALS — BP 111/80 | HR 93 | Resp 20 | Ht 65.0 in | Wt 170.9 lb

## 2021-08-30 DIAGNOSIS — R790 Abnormal level of blood mineral: Secondary | ICD-10-CM

## 2021-08-30 DIAGNOSIS — Z0000 Encounter for general adult medical examination without abnormal findings: Secondary | ICD-10-CM

## 2021-08-30 DIAGNOSIS — F419 Anxiety disorder, unspecified: Secondary | ICD-10-CM | POA: Diagnosis not present

## 2021-08-30 DIAGNOSIS — Z7689 Persons encountering health services in other specified circumstances: Secondary | ICD-10-CM | POA: Diagnosis not present

## 2021-08-30 DIAGNOSIS — Z1329 Encounter for screening for other suspected endocrine disorder: Secondary | ICD-10-CM

## 2021-08-30 DIAGNOSIS — F411 Generalized anxiety disorder: Secondary | ICD-10-CM

## 2021-08-30 DIAGNOSIS — L821 Other seborrheic keratosis: Secondary | ICD-10-CM

## 2021-08-30 DIAGNOSIS — Z131 Encounter for screening for diabetes mellitus: Secondary | ICD-10-CM

## 2021-08-30 DIAGNOSIS — E038 Other specified hypothyroidism: Secondary | ICD-10-CM

## 2021-08-30 MED ORDER — ESCITALOPRAM OXALATE 20 MG PO TABS: ORAL_TABLET | ORAL | 3 refills | Status: DC

## 2021-08-30 MED ORDER — LEVOCETIRIZINE DIHYDROCHLORIDE 5 MG PO TABS: 5.0000 mg | ORAL_TABLET | Freq: Every evening | ORAL | 1 refills | Status: DC

## 2021-08-30 MED ORDER — KETOCONAZOLE 2 % EX SHAM: MEDICATED_SHAMPOO | CUTANEOUS | 11 refills | Status: DC

## 2021-08-30 MED ORDER — ALPRAZOLAM 0.5 MG PO TABS: 0.2500 mg | ORAL_TABLET | Freq: Two times a day (BID) | ORAL | 1 refills | Status: DC | PRN

## 2021-08-30 NOTE — Progress Notes (Signed)
HPI: Anna Hines is a 40 y.o. female who  has a past medical history of Allergy (December 24, 1980), Anxiety (2013), Cholestasis during pregnancy, Hyperlipidemia, and Leukocytosis (03/08/2019).  she presents to [facility] today, 08/30/21,  for chief complaint of: Annual physical exam  Dentist: UTD, upcoming appointment for gum cleaning Eye exam: 2 years ago, wears contacts and glasses Exercise: None intentional Diet: No restrictions, eats all food groups Pap smear: UTD COVID vaccine: Completed but no booster yet  Concerns: None Past medical, surgical, social and family history reviewed:  Patient Active Problem List   Diagnosis Date Noted   Low serum ferritin level 03/08/2019   Leukocytosis 03/08/2019   Rash and nonspecific skin eruption 02/25/2019   Postpartum hemorrhage 12/28/2018   Encounter for induction of labor 12/28/2018   Obstetric vaginal laceration with second degree perineal laceration 12/28/2018   Maternal fever during labor 12/28/2018   Gestational hypertension, third trimester 12/27/2018   Cholestasis 12/26/2018   Breech presentation 12/26/2018   Cholestasis during pregnancy 12/21/2018   Itching 12/16/2018   Excessive weight gain during pregnancy 09/01/2018   Supervision of other normal pregnancy, antepartum 06/16/2018   Anxiety 08/11/2017   Failure to attend appointment 05/23/2016   Counseling on health promotion and disease prevention 04/18/2016   History of hyperlipidemia 04/18/2016   Generalized anxiety disorder 04/11/2016    Past Surgical History:  Procedure Laterality Date   WISDOM TOOTH EXTRACTION      Social History   Tobacco Use   Smoking status: Every Day    Packs/day: 0.50    Years: 2.00    Pack years: 1.00    Types: Cigarettes   Smokeless tobacco: Never   Tobacco comments:    As per pt - having 3 cigs daily  Substance Use Topics   Alcohol use: Yes    Alcohol/week: 30.0 standard drinks    Types: 30  Standard drinks or equivalent per week    Comment: wine and beer mixed    Family History  Problem Relation Age of Onset   Cancer Mother        CERVICAL   Hyperlipidemia Father    Alcohol abuse Maternal Grandmother    Cancer Maternal Grandmother        BREAST   Stroke Maternal Grandmother    Hyperlipidemia Paternal Grandmother    Heart attack Paternal Grandfather      Current medication list and allergy/intolerance information reviewed:    Current Outpatient Medications  Medication Sig Dispense Refill   ketoconazole (NIZORAL) 2 % shampoo Apply to scalp twice a week for 8 weeks and then weekly thereafter 120 mL 11   levocetirizine (XYZAL) 5 MG tablet Take 1 tablet (5 mg total) by mouth every evening. 30 tablet 1   ALPRAZolam (XANAX) 0.5 MG tablet Take 0.5-1 tablets (0.25-0.5 mg total) by mouth 2 (two) times daily as needed for anxiety (Use sparingly to avoid tolerance/dependence). 15 tablet 1   escitalopram (LEXAPRO) 20 MG tablet Take 1 tablet by mouth once daily. 90 tablet 3   Multiple Vitamins-Minerals (MULTIVITAMIN ADULT PO) Take 1 tablet by mouth daily.     No current facility-administered medications for this visit.    No Known Allergies    Review of Systems: Constitutional:  No  fever, no chills, No recent illness, No unintentional weight changes. No significant fatigue.  HEENT: No  headache, no vision change, no hearing change, No sore throat, No  sinus pressure Cardiac: No  chest pain, No  pressure,  No palpitations, No  Orthopnea Respiratory: + Occasional shortness of breath, + occasional wheezing. No  Cough Gastrointestinal: No  abdominal pain, No  nausea, No  vomiting,  No  blood in stool, No  diarrhea, No  constipation  Musculoskeletal: No new myalgia/arthralgia Skin: No  Rash, No other wounds/concerning lesions Genitourinary: No  incontinence, No  abnormal genital bleeding, No abnormal genital discharge Hem/Onc: No  easy bruising/bleeding, No  abnormal lymph  node Endocrine: No cold intolerance,  No heat intolerance. No polyuria/polydipsia/polyphagia  Neurologic: No  weakness, No  dizziness, No  slurred speech/focal weakness/facial droop Psychiatric: No  concerns with depression, + concerns with anxiety, No sleep problems, No mood problems  Exam:  BP 111/80 (BP Location: Left Arm, Patient Position: Sitting, Cuff Size: Normal)   Pulse 93   Resp 20   Ht 5' 5"  (1.651 m)   Wt 170 lb 14.4 oz (77.5 kg)   SpO2 97%   BMI 28.44 kg/m  Constitutional: VS see above. General Appearance: alert, well-developed, well-nourished, NAD Eyes: Normal lids and conjunctive, non-icteric sclera Ears, Nose, Mouth, Throat: MMM, Normal external inspection ears. TM normal bilaterally.  Neck: No masses, trachea midline. No thyroid enlargement. No tenderness/mass appreciated. No lymphadenopathy Respiratory: Normal respiratory effort. no wheeze, no rhonchi, no rales Cardiovascular: S1/S2 normal, no murmur, no rub/gallop auscultated. RRR. No lower extremity edema. Pedal pulse II/IV bilaterally PT. No carotid bruit or JVD. No abdominal aortic bruit. Gastrointestinal: Nontender, no masses. No hepatomegaly, no splenomegaly. No hernia appreciated. Bowel sounds normal. Rectal exam deferred.  Musculoskeletal: Gait normal. No clubbing/cyanosis of digits.  Neurological: Normal balance/coordination. No tremor. No cranial nerve deficit on limited exam. Motor and sensation intact and symmetric. Cerebellar reflexes intact.  Skin: warm, dry, intact. No rash/ulcer. No concerning nevi or subq nodules on limited exam.   Psychiatric: Normal judgment/insight. Normal mood and affect. Oriented x3.    ASSESSMENT/PLAN:   1. Encounter to establish care Reviewed available information and discussed care concerns with patient.   2. Annual physical exam Checking labs.  Recommend increasing intentional exercise, dietary modifications, and weight loss.  Discussed elevated cholesterol in the past and  the recommendation for management.  If continues to be high, she is interested in starting a medication in addition to lifestyle modifications. - Lipid panel - COMPLETE METABOLIC PANEL WITH GFR - CBC with Differential/Platelet  3. Anxiety Continue Lexapro 20 mg daily.  Continue very sparing use of Xanax 0.5 mg as needed.  Discussed limiting use to prevent tolerance and dependence.  4. Low serum ferritin level Checking iron panel today. - Fe+TIBC+Fer  5. Thyroid disorder screen Checking TSH today. - TSH  6. Diabetes mellitus screening Checking hemoglobin A1c. - Hemoglobin A1c  7. Generalized anxiety disorder Continue Lexapro.  Continue Xanax as above. - escitalopram (LEXAPRO) 20 MG tablet; Take 1 tablet by mouth once daily.  Dispense: 90 tablet; Refill: 3 - ALPRAZolam (XANAX) 0.5 MG tablet; Take 0.5-1 tablets (0.25-0.5 mg total) by mouth 2 (two) times daily as needed for anxiety (Use sparingly to avoid tolerance/dependence).  Dispense: 15 tablet; Refill: 1  8. Seborrheic keratosis of scalp Start ketoconazole 2% shampoo twice weekly for the next 8 weeks to the affected areas on the scalp.    Orders Placed This Encounter  Procedures   TSH   Lipid panel   COMPLETE METABOLIC PANEL WITH GFR   CBC with Differential/Platelet   Hemoglobin A1c   Fe+TIBC+Fer    Meds ordered this encounter  Medications   escitalopram (LEXAPRO) 20  MG tablet    Sig: Take 1 tablet by mouth once daily.    Dispense:  90 tablet    Refill:  3    Order Specific Question:   Supervising Provider    Answer:   MATTHEWS, CODY [[id number]]   ALPRAZolam (XANAX) 0.5 MG tablet    Sig: Take 0.5-1 tablets (0.25-0.5 mg total) by mouth 2 (two) times daily as needed for anxiety (Use sparingly to avoid tolerance/dependence).    Dispense:  15 tablet    Refill:  1    Maximum 15 tabs in 30 days.    Order Specific Question:   Supervising Provider    Answer:   MATTHEWS, CODY [[id number]]   ketoconazole (NIZORAL) 2 % shampoo     Sig: Apply to scalp twice a week for 8 weeks and then weekly thereafter    Dispense:  120 mL    Refill:  11    Order Specific Question:   Supervising Provider    Answer:   MATTHEWS, CODY [[id number]]   levocetirizine (XYZAL) 5 MG tablet    Sig: Take 1 tablet (5 mg total) by mouth every evening.    Dispense:  30 tablet    Refill:  1    Order Specific Question:   Supervising Provider    Answer:   Estell Harpin    Patient Instructions  Preventive Care 85-64 Years Old, Female Preventive care refers to lifestyle choices and visits with your health care provider that can promote health and wellness. Preventive care visits are also called wellness exams. What can I expect for my preventive care visit? Counseling During your preventive care visit, your health care provider may ask about your: Medical history, including: Past medical problems. Family medical history. Pregnancy history. Current health, including: Menstrual cycle. Method of birth control. Emotional well-being. Home life and relationship well-being. Sexual activity and sexual health. Lifestyle, including: Alcohol, nicotine or tobacco, and drug use. Access to firearms. Diet, exercise, and sleep habits. Work and work Statistician. Sunscreen use. Safety issues such as seatbelt and bike helmet use. Physical exam Your health care provider may check your: Height and weight. These may be used to calculate your BMI (body mass index). BMI is a measurement that tells if you are at a healthy weight. Waist circumference. This measures the distance around your waistline. This measurement also tells if you are at a healthy weight and may help predict your risk of certain diseases, such as type 2 diabetes and high blood pressure. Heart rate and blood pressure. Body temperature. Skin for abnormal spots. What immunizations do I need? Vaccines are usually given at various ages, according to a schedule. Your health care provider will  recommend vaccines for you based on your age, medical history, and lifestyle or other factors, such as travel or where you work. What tests do I need? Screening Your health care provider may recommend screening tests for certain conditions. This may include: Pelvic exam and Pap test. Lipid and cholesterol levels. Diabetes screening. This is done by checking your blood sugar (glucose) after you have not eaten for a while (fasting). Hepatitis B test. Hepatitis C test. HIV (human immunodeficiency virus) test. STI (sexually transmitted infection) testing, if you are at risk. BRCA-related cancer screening. This may be done if you have a family history of breast, ovarian, tubal, or peritoneal cancers. Talk with your health care provider about your test results, treatment options, and if necessary, the need for more tests. Follow these instructions at home: Eating and  drinking  Eat a healthy diet that includes fresh fruits and vegetables, whole grains, lean protein, and low-fat dairy products. Take vitamin and mineral supplements as recommended by your health care provider. Do not drink alcohol if: Your health care provider tells you not to drink. You are pregnant, may be pregnant, or are planning to become pregnant. If you drink alcohol: Limit how much you have to 0-1 drink a day. Know how much alcohol is in your drink. In the [place] one drink equals one 12 oz bottle of beer (355 mL), one 5 oz glass of wine (148 mL), or one 1 oz glass of hard liquor (44 mL). Lifestyle Brush your teeth every morning and night with fluoride toothpaste. Floss one time each day. Exercise for at least 30 minutes 5 or more days each week. Do not use any products that contain nicotine or tobacco. These products include cigarettes, chewing tobacco, and vaping devices, such as e-cigarettes. If you need help quitting, ask your health care provider. Do not use drugs. If you are sexually active, practice safe sex. Use a  condom or other form of protection to prevent STIs. If you do not wish to become pregnant, use a form of birth control. If you plan to become pregnant, see your health care provider for a prepregnancy visit. Find healthy ways to manage stress, such as: Meditation, yoga, or listening to music. Journaling. Talking to a trusted person. Spending time with friends and family. Minimize exposure to UV radiation to reduce your risk of skin cancer. Safety Always wear your seat belt while driving or riding in a vehicle. Do not drive: If you have been drinking alcohol. Do not ride with someone who has been drinking. If you have been using any mind-altering substances or drugs. While texting. When you are tired or distracted. Wear a helmet and other protective equipment during sports activities. If you have firearms in your house, make sure you follow all gun safety procedures. Seek help if you have been physically or sexually abused. What's next? Go to your health care provider once a year for an annual wellness visit. Ask your health care provider how often you should have your eyes and teeth checked. Stay up to date on all vaccines. This information is not intended to replace advice given to you by your health care provider. Make sure you discuss any questions you have with your health care provider. Document Revised: 04/04/2021 Document Reviewed: 04/04/2021 Elsevier Patient Education  [place].   Follow-up plan: Return in about 1 year (around 08/30/2022) for annual physical exam or sooner if needed.  Clearnce Sorrel, DNP, APRN, FNP-BC [facility]

## 2021-08-30 NOTE — Progress Notes (Deleted)
.  [email or web address]

## 2021-08-30 NOTE — Patient Instructions (Signed)
Preventive Care 5-38 Years Old, Female Preventive care refers to lifestyle choices and visits with your health care provider that can promote health and wellness. Preventive care visits are also called wellness exams. What can I expect for my preventive care visit? Counseling During your preventive care visit, your health care provider may ask about your: Medical history, including: Past medical problems. Family medical history. Pregnancy history. Current health, including: Menstrual cycle. Method of birth control. Emotional well-being. Home life and relationship well-being. Sexual activity and sexual health. Lifestyle, including: Alcohol, nicotine or tobacco, and drug use. Access to firearms. Diet, exercise, and sleep habits. Work and work Statistician. Sunscreen use. Safety issues such as seatbelt and bike helmet use. Physical exam Your health care provider may check your: Height and weight. These may be used to calculate your BMI (body mass index). BMI is a measurement that tells if you are at a healthy weight. Waist circumference. This measures the distance around your waistline. This measurement also tells if you are at a healthy weight and may help predict your risk of certain diseases, such as type 2 diabetes and high blood pressure. Heart rate and blood pressure. Body temperature. Skin for abnormal spots. What immunizations do I need? Vaccines are usually given at various ages, according to a schedule. Your health care provider will recommend vaccines for you based on your age, medical history, and lifestyle or other factors, such as travel or where you work. What tests do I need? Screening Your health care provider may recommend screening tests for certain conditions. This may include: Pelvic exam and Pap test. Lipid and cholesterol levels. Diabetes screening. This is done by checking your blood sugar (glucose) after you have not eaten for a while (fasting). Hepatitis B  test. Hepatitis C test. HIV (human immunodeficiency virus) test. STI (sexually transmitted infection) testing, if you are at risk. BRCA-related cancer screening. This may be done if you have a family history of breast, ovarian, tubal, or peritoneal cancers. Talk with your health care provider about your test results, treatment options, and if necessary, the need for more tests. Follow these instructions at home: Eating and drinking  Eat a healthy diet that includes fresh fruits and vegetables, whole grains, lean protein, and low-fat dairy products. Take vitamin and mineral supplements as recommended by your health care provider. Do not drink alcohol if: Your health care provider tells you not to drink. You are pregnant, may be pregnant, or are planning to become pregnant. If you drink alcohol: Limit how much you have to 0-1 drink a day. Know how much alcohol is in your drink. In the [place] one drink equals one 12 oz bottle of beer (355 mL), one 5 oz glass of wine (148 mL), or one 1 oz glass of hard liquor (44 mL). Lifestyle Brush your teeth every morning and night with fluoride toothpaste. Floss one time each day. Exercise for at least 30 minutes 5 or more days each week. Do not use any products that contain nicotine or tobacco. These products include cigarettes, chewing tobacco, and vaping devices, such as e-cigarettes. If you need help quitting, ask your health care provider. Do not use drugs. If you are sexually active, practice safe sex. Use a condom or other form of protection to prevent STIs. If you do not wish to become pregnant, use a form of birth control. If you plan to become pregnant, see your health care provider for a prepregnancy visit. Find healthy ways to manage stress, such as: Meditation, yoga,  or listening to music. °Journaling. °Talking to a trusted person. °Spending time with friends and family. °Minimize exposure to UV radiation to reduce your risk of skin  cancer. °Safety °Always wear your seat belt while driving or riding in a vehicle. °Do not drive: °If you have been drinking alcohol. Do not ride with someone who has been drinking. °If you have been using any mind-altering substances or drugs. °While texting. °When you are tired or distracted. °Wear a helmet and other protective equipment during sports activities. °If you have firearms in your house, make sure you follow all gun safety procedures. °Seek help if you have been physically or sexually abused. °What's next? °Go to your health care provider once a year for an annual wellness visit. °Ask your health care provider how often you should have your eyes and teeth checked. °Stay up to date on all vaccines. °This information is not intended to replace advice given to you by your health care provider. Make sure you discuss any questions you have with your health care provider. °Document Revised: 04/04/2021 Document Reviewed: 04/04/2021 °Elsevier Patient Education © 2022 [employer] ° °

## 2021-08-31 ENCOUNTER — Encounter: Payer: Self-pay | Admitting: Medical-Surgical

## 2021-08-31 LAB — LIPID PANEL
Cholesterol: 375 mg/dL — ABNORMAL HIGH (ref <200)
HDL: 103 mg/dL (ref >=50)
LDL Cholesterol (Calc): 250 mg/dL (calc) — ABNORMAL HIGH
Non-HDL Cholesterol (Calc): 272 mg/dL (calc) — ABNORMAL HIGH (ref <130)
Total CHOL/HDL Ratio: 3.6 (calc) (ref <5.0)
Triglycerides: 95 mg/dL (ref <150)

## 2021-08-31 LAB — COMPLETE METABOLIC PANEL WITHOUT GFR
AG Ratio: 1.6 (calc) (ref 1.0–2.5)
ALT: 74 U/L — ABNORMAL HIGH (ref 6–29)
AST: 43 U/L — ABNORMAL HIGH (ref 10–30)
Albumin: 4.6 g/dL (ref 3.6–5.1)
Alkaline phosphatase (APISO): 74 U/L (ref 31–125)
BUN: 16 mg/dL (ref 7–25)
CO2: 22 mmol/L (ref 20–32)
Calcium: 9.5 mg/dL (ref 8.6–10.2)
Chloride: 102 mmol/L (ref 98–110)
Creat: 0.62 mg/dL (ref 0.50–0.97)
Globulin: 2.9 g/dL (ref 1.9–3.7)
Glucose, Bld: 83 mg/dL (ref 65–99)
Potassium: 4.6 mmol/L (ref 3.5–5.3)
Sodium: 137 mmol/L (ref 135–146)
Total Bilirubin: 0.6 mg/dL (ref 0.2–1.2)
Total Protein: 7.5 g/dL (ref 6.1–8.1)
eGFR: 117 mL/min/{1.73_m2}

## 2021-08-31 LAB — CBC WITH DIFFERENTIAL/PLATELET
Absolute Monocytes: 774 cells/uL (ref 200–950)
Basophils Absolute: 146 cells/uL (ref 0–200)
Basophils Relative: 1.6 %
Eosinophils Absolute: 610 cells/uL — ABNORMAL HIGH (ref 15–500)
Eosinophils Relative: 6.7 %
HCT: 47.5 % — ABNORMAL HIGH (ref 35.0–45.0)
Hemoglobin: 15.8 g/dL — ABNORMAL HIGH (ref 11.7–15.5)
Lymphs Abs: 2439 cells/uL (ref 850–3900)
MCH: 32.1 pg (ref 27.0–33.0)
MCHC: 33.3 g/dL (ref 32.0–36.0)
MCV: 96.5 fL (ref 80.0–100.0)
MPV: 11.3 fL (ref 7.5–12.5)
Monocytes Relative: 8.5 %
Neutro Abs: 5132 cells/uL (ref 1500–7800)
Neutrophils Relative %: 56.4 %
Platelets: 239 10*3/uL (ref 140–400)
RBC: 4.92 10*6/uL (ref 3.80–5.10)
RDW: 14.7 % (ref 11.0–15.0)
Total Lymphocyte: 26.8 %
WBC: 9.1 10*3/uL (ref 3.8–10.8)

## 2021-08-31 LAB — IRON,TIBC AND FERRITIN PANEL
%SAT: 37 % (calc) (ref 16–45)
Ferritin: 109 ng/mL (ref 16–154)
Iron: 156 ug/dL (ref 40–190)
TIBC: 426 mcg/dL (calc) (ref 250–450)

## 2021-08-31 LAB — TSH: TSH: 0.95 m[IU]/L

## 2021-09-03 ENCOUNTER — Other Ambulatory Visit: Payer: Self-pay | Admitting: Medical-Surgical

## 2021-09-03 DIAGNOSIS — E7849 Other hyperlipidemia: Secondary | ICD-10-CM

## 2021-09-03 MED ORDER — ROSUVASTATIN CALCIUM 20 MG PO TABS: 20.0000 mg | ORAL_TABLET | Freq: Every day | ORAL | 0 refills | Status: DC

## 2021-11-02 DIAGNOSIS — E7849 Other hyperlipidemia: Secondary | ICD-10-CM | POA: Diagnosis not present

## 2021-11-03 LAB — LIPID PANEL
Cholesterol: 145 mg/dL (ref <200)
HDL: 48 mg/dL — ABNORMAL LOW (ref >=50)
LDL Cholesterol (Calc): 83 mg/dL (calc)
Non-HDL Cholesterol (Calc): 97 mg/dL (calc) (ref <130)
Total CHOL/HDL Ratio: 3 (calc) (ref <5.0)
Triglycerides: 68 mg/dL (ref <150)

## 2021-11-03 LAB — HEPATIC FUNCTION PANEL
AG Ratio: 1.7 (calc) (ref 1.0–2.5)
ALT: 15 U/L (ref 6–29)
AST: 14 U/L (ref 10–30)
Albumin: 4.3 g/dL (ref 3.6–5.1)
Alkaline phosphatase (APISO): 54 U/L (ref 31–125)
Bilirubin, Direct: 0.1 mg/dL (ref 0.0–0.2)
Globulin: 2.6 g/dL (calc) (ref 1.9–3.7)
Indirect Bilirubin: 0.3 mg/dL (calc) (ref 0.2–1.2)
Total Bilirubin: 0.4 mg/dL (ref 0.2–1.2)
Total Protein: 6.9 g/dL (ref 6.1–8.1)

## 2021-11-16 ENCOUNTER — Other Ambulatory Visit: Payer: Self-pay

## 2021-11-16 DIAGNOSIS — F411 Generalized anxiety disorder: Secondary | ICD-10-CM

## 2021-11-16 MED ORDER — ESCITALOPRAM OXALATE 20 MG PO TABS: ORAL_TABLET | ORAL | 3 refills | Status: DC

## 2021-11-19 ENCOUNTER — Other Ambulatory Visit: Payer: Self-pay

## 2021-11-19 DIAGNOSIS — E7849 Other hyperlipidemia: Secondary | ICD-10-CM

## 2021-11-19 DIAGNOSIS — F411 Generalized anxiety disorder: Secondary | ICD-10-CM

## 2021-11-19 DIAGNOSIS — R21 Rash and other nonspecific skin eruption: Secondary | ICD-10-CM

## 2021-11-19 DIAGNOSIS — J302 Other seasonal allergic rhinitis: Secondary | ICD-10-CM

## 2021-11-19 MED ORDER — LEVOCETIRIZINE DIHYDROCHLORIDE 5 MG PO TABS: 5.0000 mg | ORAL_TABLET | Freq: Every evening | ORAL | 1 refills | Status: DC

## 2021-11-19 MED ORDER — ROSUVASTATIN CALCIUM 20 MG PO TABS: 20.0000 mg | ORAL_TABLET | Freq: Every day | ORAL | 0 refills | Status: DC

## 2021-11-19 NOTE — Progress Notes (Signed)
Left VM with information.  

## 2021-11-19 NOTE — Progress Notes (Signed)
Pt requested refill of her Xanax. Last sent in on 08/30/21 with 1 refill.

## 2021-11-19 NOTE — Progress Notes (Signed)
Looks like the last time she had this medication filled was on 08/30/2021.  The refill does not appear to have been picked up or documented in our [place] system.  Please have the patient call her pharmacy to see if a refill is available.  ___________________________________________ Clearnce Sorrel, DNP, APRN, FNP-BC [facility] [place]

## 2021-12-13 ENCOUNTER — Other Ambulatory Visit: Payer: Self-pay

## 2021-12-13 DIAGNOSIS — F411 Generalized anxiety disorder: Secondary | ICD-10-CM

## 2021-12-13 MED ORDER — ALPRAZOLAM 0.5 MG PO TABS: 0.2500 mg | ORAL_TABLET | Freq: Two times a day (BID) | ORAL | 1 refills | Status: DC | PRN

## 2021-12-13 NOTE — Telephone Encounter (Signed)
Last OV 08/30/2021  Return 1 year  Labs 11/02/2021  Last refill 08/30/2021

## 2022-01-07 ENCOUNTER — Other Ambulatory Visit: Payer: Self-pay

## 2022-01-07 DIAGNOSIS — F411 Generalized anxiety disorder: Secondary | ICD-10-CM

## 2022-01-07 NOTE — Progress Notes (Signed)
Rx refused.  Number 15 sent on 12/13/21 with 1 refill.  #15 tabs has to last her 30 days.   ? ?CM

## 2022-01-20 ENCOUNTER — Other Ambulatory Visit: Payer: Self-pay | Admitting: Medical-Surgical

## 2022-01-20 DIAGNOSIS — J302 Other seasonal allergic rhinitis: Secondary | ICD-10-CM

## 2022-01-20 DIAGNOSIS — E7849 Other hyperlipidemia: Secondary | ICD-10-CM

## 2022-09-05 ENCOUNTER — Other Ambulatory Visit: Payer: Self-pay | Admitting: Medical-Surgical

## 2022-09-05 DIAGNOSIS — F411 Generalized anxiety disorder: Secondary | ICD-10-CM

## 2022-09-09 NOTE — Telephone Encounter (Signed)
Called patient left message to schedule an appointment with Christen Butter for a annual physical it was due in November. tvt

## 2022-10-03 ENCOUNTER — Other Ambulatory Visit: Payer: Self-pay | Admitting: Medical-Surgical

## 2022-10-03 DIAGNOSIS — F411 Generalized anxiety disorder: Secondary | ICD-10-CM

## 2022-10-13 ENCOUNTER — Other Ambulatory Visit: Payer: Self-pay | Admitting: Medical-Surgical

## 2022-10-13 DIAGNOSIS — E7849 Other hyperlipidemia: Secondary | ICD-10-CM

## 2022-11-05 ENCOUNTER — Encounter: Payer: Self-pay | Admitting: Medical-Surgical

## 2022-11-05 ENCOUNTER — Ambulatory Visit (INDEPENDENT_AMBULATORY_CARE_PROVIDER_SITE_OTHER): Payer: BC Managed Care – PPO | Admitting: Medical-Surgical

## 2022-11-05 VITALS — BP 107/76 | HR 90 | Resp 20 | Ht 65.0 in | Wt 187.3 lb

## 2022-11-05 DIAGNOSIS — J302 Other seasonal allergic rhinitis: Secondary | ICD-10-CM | POA: Diagnosis not present

## 2022-11-05 DIAGNOSIS — F411 Generalized anxiety disorder: Secondary | ICD-10-CM | POA: Diagnosis not present

## 2022-11-05 DIAGNOSIS — Z0000 Encounter for general adult medical examination without abnormal findings: Secondary | ICD-10-CM | POA: Diagnosis not present

## 2022-11-05 DIAGNOSIS — Z131 Encounter for screening for diabetes mellitus: Secondary | ICD-10-CM

## 2022-11-05 DIAGNOSIS — Z1329 Encounter for screening for other suspected endocrine disorder: Secondary | ICD-10-CM | POA: Diagnosis not present

## 2022-11-05 DIAGNOSIS — E7849 Other hyperlipidemia: Secondary | ICD-10-CM | POA: Diagnosis not present

## 2022-11-05 DIAGNOSIS — Z23 Encounter for immunization: Secondary | ICD-10-CM

## 2022-11-05 DIAGNOSIS — Z716 Tobacco abuse counseling: Secondary | ICD-10-CM

## 2022-11-05 MED ORDER — ROSUVASTATIN CALCIUM 20 MG PO TABS: 20.0000 mg | ORAL_TABLET | Freq: Every day | ORAL | 3 refills | Status: DC

## 2022-11-05 MED ORDER — ESCITALOPRAM OXALATE 20 MG PO TABS: 20.0000 mg | ORAL_TABLET | Freq: Every day | ORAL | 3 refills | Status: DC

## 2022-11-05 MED ORDER — BUPROPION HCL ER (XL) 150 MG PO TB24: 150.0000 mg | ORAL_TABLET | Freq: Every day | ORAL | 1 refills | Status: DC

## 2022-11-05 MED ORDER — LEVOCETIRIZINE DIHYDROCHLORIDE 5 MG PO TABS: 5.0000 mg | ORAL_TABLET | Freq: Every evening | ORAL | 3 refills | Status: DC

## 2022-11-05 NOTE — Progress Notes (Signed)
Complete physical exam  Patient: Anna Hines   DOB: 1981-06-23   42 y.o. Female  MRN: [id number]  Subjective:    Chief Complaint  Patient presents with   Annual Exam    Anna Hines is a 42 y.o. female who presents today for a complete physical exam. She reports consuming a  Weight Watcher's diet. Home exercise routine includes boxing app and some light weights. She generally feels fairly well. She reports sleeping well. She does not have additional problems to discuss today.    Most recent fall risk assessment:    11/05/2022    9:29 AM  Fall Risk   Falls in the past year? 0  Number falls in past yr: 0  Injury with Fall? 0  Risk for fall due to : No Fall Risks  Follow up Falls evaluation completed     Most recent depression screenings:    11/05/2022    9:29 AM 08/30/2021    8:57 AM  PHQ 2/9 Scores  PHQ - 2 Score 0 0    Vision:Not within last year , Dental: No current dental problems and Receives regular dental care, and STD: The patient denies history of sexually transmitted disease.    Patient Care Team: Samuel Bouche, NP as PCP - General (Nurse Practitioner)   Outpatient Medications Prior to Visit  Medication Sig   ALPRAZolam (XANAX) 0.5 MG tablet Take 0.5-1 tablets (0.25-0.5 mg total) by mouth 2 (two) times daily as needed for anxiety (Use sparingly to avoid tolerance/dependence).   Multiple Vitamins-Minerals (MULTIVITAMIN ADULT PO) Take 1 tablet by mouth daily.   [DISCONTINUED] escitalopram (LEXAPRO) 20 MG tablet TAKE 1 TABLET BY MOUTH ONCE  DAILY   [DISCONTINUED] levocetirizine (XYZAL) 5 MG tablet TAKE 1 TABLET BY MOUTH IN THE  EVENING   [DISCONTINUED] rosuvastatin (CRESTOR) 20 MG tablet TAKE 1 TABLET BY MOUTH DAILY   [DISCONTINUED] ketoconazole (NIZORAL) 2 % shampoo Apply to scalp twice a week for 8 weeks and then weekly thereafter   No facility-administered medications prior to visit.    Review of Systems  Constitutional:  Negative for chills,  fever, malaise/fatigue and weight loss.  HENT:  Negative for congestion, ear pain, hearing loss, sinus pain and sore throat.   Eyes:  Negative for blurred vision, photophobia and pain.  Respiratory:  Negative for cough, shortness of breath and wheezing.   Cardiovascular:  Negative for chest pain, palpitations and leg swelling.  Gastrointestinal:  Negative for abdominal pain, constipation, diarrhea, heartburn, nausea and vomiting.  Genitourinary:  Negative for dysuria, frequency and urgency.  Musculoskeletal:  Negative for falls and neck pain.  Skin:  Negative for itching and rash.  Neurological:  Negative for dizziness, weakness and headaches.  Endo/Heme/Allergies:  Negative for polydipsia. Does not bruise/bleed easily.  Psychiatric/Behavioral:  Negative for depression, substance abuse and suicidal ideas. The patient is not nervous/anxious.      Objective:    BP 107/76 (BP Location: Right Arm, Cuff Size: Large)   Pulse 90   Resp 20   Ht 5\' 5"  (1.651 m)   Wt 187 lb 4.8 oz (85 kg)   SpO2 96%   BMI 31.17 kg/m    Physical Exam Constitutional:      General: She is not in acute distress.    Appearance: Normal appearance. She is not ill-appearing.  HENT:     Head: Normocephalic and atraumatic.     Right Ear: Tympanic membrane normal.     Left Ear: Tympanic membrane normal.  Nose: Nose normal.     Mouth/Throat:     Mouth: Mucous membranes are moist.     Pharynx: No oropharyngeal exudate or posterior oropharyngeal erythema.  Eyes:     Extraocular Movements: Extraocular movements intact.     Conjunctiva/sclera: Conjunctivae normal.     Pupils: Pupils are equal, round, and reactive to light.  Neck:     Thyroid: No thyromegaly.     Vascular: No carotid bruit or JVD.     Trachea: Trachea normal.  Cardiovascular:     Rate and Rhythm: Normal rate and regular rhythm.     Pulses: Normal pulses.     Heart sounds: Normal heart sounds. No murmur heard.    No friction rub. No gallop.   Pulmonary:     Effort: Pulmonary effort is normal. No respiratory distress.     Breath sounds: Normal breath sounds. No wheezing.  Abdominal:     General: Bowel sounds are normal. There is no distension.     Palpations: Abdomen is soft.     Tenderness: There is no abdominal tenderness. There is no guarding.  Musculoskeletal:        General: Normal range of motion.     Cervical back: Normal range of motion and neck supple.  Skin:    General: Skin is warm and dry.  Neurological:     Mental Status: She is alert and oriented to person, place, and time.     Cranial Nerves: No cranial nerve deficit.  Psychiatric:        Mood and Affect: Mood normal.        Behavior: Behavior normal.        Thought Content: Thought content normal.        Judgment: Judgment normal.    No results found for any visits on 11/05/22.     Assessment & Plan:    Routine Health Maintenance and Physical Exam  Immunization History  Administered Date(s) Administered   Influenza,inj,Quad PF,6+ Mos 06/16/2018   Influenza-Unspecified 07/21/2016   PFIZER(Purple Top)SARS-COV-2 Vaccination 03/24/2020, 04/21/2020   Tdap 10/30/2018    Health Maintenance  Topic Date Due   Hepatitis C Screening  Never done   PAP SMEAR-Modifier  06/19/2015   INFLUENZA VACCINE  05/21/2022   COVID-19 Vaccine (3 - Pfizer risk series) 11/21/2022 (Originally 05/19/2020)   DTaP/Tdap/Td (2 - Td or Tdap) 10/30/2028   HIV Screening  Completed   HPV VACCINES  Aged Out    Discussed health benefits of physical activity, and encouraged her to engage in regular exercise appropriate for her age and condition.  1. Annual physical exam Checking labs as below. UTD on preventative care. Wellness information provided with AVS. - Lipid panel - COMPLETE METABOLIC PANEL WITH GFR - CBC with Differential/Platelet  2. Need for influenza vaccination Flu vaccine given in office today. - Flu Vaccine QUAD 6+ mos PF IM (Fluarix Quad PF)  3.  Generalized anxiety disorder Continue Lexapro 20 mg daily.  Continue very sparing use of Xanax. - escitalopram (LEXAPRO) 20 MG tablet; Take 1 tablet (20 mg total) by mouth daily.  Dispense: 90 tablet; Refill: 3  4. Seasonal allergies Continue Xyzal 5 mg daily. - levocetirizine (XYZAL) 5 MG tablet; Take 1 tablet (5 mg total) by mouth every evening.  Dispense: 90 tablet; Refill: 3  5. Familial hyperlipidemia Checking labs as below.  Continue rosuvastatin 20 mg daily. - Lipid panel - COMPLETE METABOLIC PANEL WITH GFR - rosuvastatin (CRESTOR) 20 MG tablet; Take 1 tablet (20  mg total) by mouth daily.  Dispense: 90 tablet; Refill: 3  6. Thyroid disorder screen Checking TSH. - TSH  7. Diabetes mellitus screening Checking A1c. - Hemoglobin A1c  8. Encounter for tobacco use cessation counseling Discussed recommendations for smoking cessation.  Patient is aware of risks of continuing to smoke as well as benefits of quitting.  Reviewed various options to help with smoking cessation.  She would like to try Wellbutrin so starting 150 mg daily.  Return in about 6 weeks (around 12/17/2022) for wellbutrin follow up.   Samuel Bouche, NP

## 2022-11-06 LAB — CBC WITH DIFFERENTIAL/PLATELET
Absolute Monocytes: 479 cells/uL (ref 200–950)
Basophils Absolute: 82 cells/uL (ref 0–200)
Basophils Relative: 0.8 %
Eosinophils Absolute: 632 cells/uL — ABNORMAL HIGH (ref 15–500)
Eosinophils Relative: 6.2 %
HCT: 46.7 % — ABNORMAL HIGH (ref 35.0–45.0)
Hemoglobin: 15.5 g/dL (ref 11.7–15.5)
Lymphs Abs: 2346 cells/uL (ref 850–3900)
MCH: 31.4 pg (ref 27.0–33.0)
MCHC: 33.2 g/dL (ref 32.0–36.0)
MCV: 94.5 fL (ref 80.0–100.0)
MPV: 11.4 fL (ref 7.5–12.5)
Monocytes Relative: 4.7 %
Neutro Abs: 6661 cells/uL (ref 1500–7800)
Neutrophils Relative %: 65.3 %
Platelets: 215 10*3/uL (ref 140–400)
RBC: 4.94 10*6/uL (ref 3.80–5.10)
RDW: 12.5 % (ref 11.0–15.0)
Total Lymphocyte: 23 %
WBC: 10.2 10*3/uL (ref 3.8–10.8)

## 2022-11-06 LAB — TSH: TSH: 1.73 mIU/L

## 2022-11-06 LAB — COMPLETE METABOLIC PANEL WITH GFR
AG Ratio: 1.7 (calc) (ref 1.0–2.5)
ALT: 41 U/L — ABNORMAL HIGH (ref 6–29)
AST: 22 U/L (ref 10–30)
Albumin: 4.5 g/dL (ref 3.6–5.1)
Alkaline phosphatase (APISO): 72 U/L (ref 31–125)
BUN: 10 mg/dL (ref 7–25)
CO2: 25 mmol/L (ref 20–32)
Calcium: 9.5 mg/dL (ref 8.6–10.2)
Chloride: 103 mmol/L (ref 98–110)
Creat: 0.6 mg/dL (ref 0.50–0.97)
Globulin: 2.7 g/dL (calc) (ref 1.9–3.7)
Glucose, Bld: 83 mg/dL (ref 65–99)
Potassium: 4.2 mmol/L (ref 3.5–5.3)
Sodium: 139 mmol/L (ref 135–146)
Total Bilirubin: 0.5 mg/dL (ref 0.2–1.2)
Total Protein: 7.2 g/dL (ref 6.1–8.1)
eGFR: 117 mL/min/{1.73_m2} (ref >=60)

## 2022-11-06 LAB — LIPID PANEL
Cholesterol: 177 mg/dL (ref <200)
HDL: 68 mg/dL (ref >=50)
LDL Cholesterol (Calc): 86 mg/dL (calc)
Non-HDL Cholesterol (Calc): 109 mg/dL (calc) (ref <130)
Total CHOL/HDL Ratio: 2.6 (calc) (ref <5.0)
Triglycerides: 135 mg/dL (ref <150)

## 2022-11-06 LAB — HEMOGLOBIN A1C
Hgb A1c MFr Bld: 5.7 % of total Hgb — ABNORMAL HIGH (ref <5.7)
Mean Plasma Glucose: 117 mg/dL
eAG (mmol/L): 6.5 mmol/L

## 2022-12-17 ENCOUNTER — Encounter: Payer: Self-pay | Admitting: Medical-Surgical

## 2022-12-17 ENCOUNTER — Telehealth (INDEPENDENT_AMBULATORY_CARE_PROVIDER_SITE_OTHER): Payer: BC Managed Care – PPO | Admitting: Medical-Surgical

## 2022-12-17 DIAGNOSIS — Z720 Tobacco use: Secondary | ICD-10-CM

## 2022-12-17 DIAGNOSIS — F17210 Nicotine dependence, cigarettes, uncomplicated: Secondary | ICD-10-CM

## 2022-12-17 DIAGNOSIS — Z716 Tobacco abuse counseling: Secondary | ICD-10-CM

## 2022-12-17 NOTE — Progress Notes (Signed)
Virtual Visit via Video Note  I connected with Anna Hines on 12/17/22 at 11:10 AM EST by a video enabled telemedicine application and verified that I am speaking with the correct person using two identifiers.   I discussed the limitations of evaluation and management by telemedicine and the availability of in person appointments. The patient expressed understanding and agreed to proceed.  Patient location: home Provider locations: office  Subjective:    CC: smoking cessation follow up  HPI: Pleasant 42 year old female presenting today via [place] video visit to follow-up on smoking cessation efforts.  Approximately 6 weeks ago, she started Wellbutrin 150 mg daily.  She has been tolerating the medication well without significant side effects.  Notes that it seems to be helping with her mood but has also helped to cut back on smoking.  She has not fully quit yet however she is down from 10-15 cigarettes/day to approximately 3-5.  Feels that the medication is working well for her and would like to continue at her current dose.   Past medical history, Surgical history, Family history not pertinant except as noted below, Social history, Allergies, and medications have been entered into the medical record, reviewed, and corrections made.   Review of Systems: See HPI for pertinent positives and negatives.   Objective:    General: Speaking clearly in complete sentences without any shortness of breath.  Alert and oriented x3.  Normal judgment. No apparent acute distress.  Impression and Recommendations:    1. Tobacco use Doing well on current regimen.  Continue Wellbutrin 150 mg daily.  I discussed the assessment and treatment plan with the patient. The patient was provided an opportunity to ask questions and all were answered. The patient agreed with the plan and demonstrated an understanding of the instructions.   The patient was advised to call back or seek an in-person evaluation if  the symptoms worsen or if the condition fails to improve as anticipated.  20 minutes of non-face-to-face time was provided during this encounter.  Return in about 6 months (around 06/17/2023) for Mood/smoking cessation follow-up.  Clearnce Sorrel, DNP, APRN, FNP-BC [facility]

## 2023-04-02 ENCOUNTER — Other Ambulatory Visit: Payer: Self-pay | Admitting: Medical-Surgical

## 2023-06-25 ENCOUNTER — Telehealth (INDEPENDENT_AMBULATORY_CARE_PROVIDER_SITE_OTHER): Payer: BC Managed Care – PPO | Admitting: Medical-Surgical

## 2023-06-25 ENCOUNTER — Encounter: Payer: Self-pay | Admitting: Medical-Surgical

## 2023-06-25 DIAGNOSIS — Z3009 Encounter for other general counseling and advice on contraception: Secondary | ICD-10-CM | POA: Diagnosis not present

## 2023-06-25 DIAGNOSIS — F411 Generalized anxiety disorder: Secondary | ICD-10-CM

## 2023-06-25 DIAGNOSIS — Z113 Encounter for screening for infections with a predominantly sexual mode of transmission: Secondary | ICD-10-CM

## 2023-06-25 MED ORDER — ALPRAZOLAM 0.5 MG PO TABS: 0.2500 mg | ORAL_TABLET | Freq: Two times a day (BID) | ORAL | 1 refills | Status: DC | PRN

## 2023-06-25 NOTE — Progress Notes (Signed)
Virtual Visit via Video Note  I connected with Anna Hines on 06/25/23 at  8:50 AM EDT by a video enabled telemedicine application and verified that I am speaking with the correct person using two identifiers.   I discussed the limitations of evaluation and management by telemedicine and the availability of in person appointments. The patient expressed understanding and agreed to proceed.  Patient location: home Provider locations: office  Subjective:    CC: discuss birth control  HPI: Pleasant 42 year old female presenting today via MyChart video visit to discuss birth control options.  We have discussed this in the past and she is now interested in proceeding with starting contraceptives.  She does have a family history of stroke and is a light smoker.  No history of migraine with aura.  No personal history of blood clots.  Sexually active with 1 female partner and reports using condoms but is aware that these are not 100% effective.  Has taken Ortho Tri-Cyclen in the past which was well-tolerated.  First day of her last menstrual cycle was 06/02/2023 and she has been sexually active since then.  Also reports that she is interested in getting full STI testing although no known exposures.  Taking Lexapro 20 mg daily with 150 mg daily of Wellbutrin.  Has alprazolam to use as needed for panic attacks.  She only takes half a tablet at a time and this seems to work well for her.  If not, she will take the other half.  Responsible with use of the Xanax and only takes this when absolutely necessary.  Requesting refill.   Past medical history, Surgical history, Family history not pertinant except as noted below, Social history, Allergies, and medications have been entered into the medical record, reviewed, and corrections made.   Review of Systems: See HPI for pertinent positives and negatives.   Objective:    General: Speaking clearly in complete sentences without any shortness of breath.   Alert and oriented x3.  Normal judgment. No apparent acute distress.  Impression and Recommendations:    1. Birth control counseling Discussed various options for birth control.  With family history of stroke, smoking status, and age, recommend progesterone only options.  Discussed starting Micronor which she is agreeable to.  Getting urine pregnancy test.  As long as this is negative, we will start Micronor 1 tablet daily. - Pregnancy, urine  2. Routine screening for STI (sexually transmitted infection) STI screening as below. - RPR - Hepatitis B surface antigen - Hepatitis C antibody - HIV Antibody (routine testing w rflx) - Chlamydia/Gonococcus/Trichomonas, NAA  3. Generalized anxiety disorder Continue Lexapro and Wellbutrin.  Refilling Xanax. - ALPRAZolam (XANAX) 0.5 MG tablet; Take 0.5-1 tablets (0.25-0.5 mg total) by mouth 2 (two) times daily as needed for anxiety (Use sparingly to avoid tolerance/dependence).  Dispense: 15 tablet; Refill: 1  I discussed the assessment and treatment plan with the patient. The patient was provided an opportunity to ask questions and all were answered. The patient agreed with the plan and demonstrated an understanding of the instructions.   The patient was advised to call back or seek an in-person evaluation if the symptoms worsen or if the condition fails to improve as anticipated.  Return in about 6 weeks (around 08/06/2023) for Birth control follow-up.  Thayer Ohm, DNP, APRN, FNP-BC [facility]

## 2023-06-27 DIAGNOSIS — Z113 Encounter for screening for infections with a predominantly sexual mode of transmission: Secondary | ICD-10-CM | POA: Diagnosis not present

## 2023-06-27 DIAGNOSIS — Z3009 Encounter for other general counseling and advice on contraception: Secondary | ICD-10-CM | POA: Diagnosis not present

## 2023-06-28 LAB — HEPATITIS B SURFACE ANTIGEN: Hepatitis B Surface Ag: NEGATIVE

## 2023-06-28 LAB — RPR: RPR Ser Ql: NONREACTIVE

## 2023-06-28 LAB — HEPATITIS C ANTIBODY: Hep C Virus Ab: NONREACTIVE

## 2023-06-28 LAB — HIV ANTIBODY (ROUTINE TESTING W REFLEX): HIV Screen 4th Generation wRfx: NONREACTIVE

## 2023-06-28 LAB — PREGNANCY, URINE: Preg Test, Ur: NEGATIVE

## 2023-06-30 LAB — CHLAMYDIA/GONOCOCCUS/TRICHOMONAS, NAA
Chlamydia by NAA: NEGATIVE
Gonococcus by NAA: NEGATIVE
Trich vag by NAA: NEGATIVE

## 2023-06-30 MED ORDER — NORETHINDRONE 0.35 MG PO TABS: 1.0000 | ORAL_TABLET | Freq: Every day | ORAL | 4 refills | Status: DC

## 2023-06-30 NOTE — Addendum Note (Signed)
Addended byChristen Butter on: 06/30/2023 07:06 AM   Modules accepted: Orders

## 2023-08-01 ENCOUNTER — Telehealth: Payer: Self-pay | Admitting: Medical-Surgical

## 2023-08-01 NOTE — Telephone Encounter (Signed)
Patient called and asked if you would see her mom as a new patient  [phone]

## 2023-10-22 ENCOUNTER — Other Ambulatory Visit: Payer: Self-pay | Admitting: Medical-Surgical

## 2023-10-22 DIAGNOSIS — E7849 Other hyperlipidemia: Secondary | ICD-10-CM

## 2023-11-02 ENCOUNTER — Other Ambulatory Visit: Payer: Self-pay | Admitting: Medical-Surgical

## 2023-11-06 ENCOUNTER — Other Ambulatory Visit: Payer: Self-pay | Admitting: Medical-Surgical

## 2023-11-06 DIAGNOSIS — F411 Generalized anxiety disorder: Secondary | ICD-10-CM

## 2023-12-29 ENCOUNTER — Other Ambulatory Visit: Payer: Self-pay | Admitting: Medical-Surgical

## 2023-12-29 DIAGNOSIS — E7849 Other hyperlipidemia: Secondary | ICD-10-CM

## 2024-01-10 ENCOUNTER — Other Ambulatory Visit: Payer: Self-pay | Admitting: Medical-Surgical

## 2024-01-13 ENCOUNTER — Other Ambulatory Visit: Payer: Self-pay | Admitting: Medical-Surgical

## 2024-01-13 DIAGNOSIS — J302 Other seasonal allergic rhinitis: Secondary | ICD-10-CM

## 2024-01-13 DIAGNOSIS — F411 Generalized anxiety disorder: Secondary | ICD-10-CM

## 2024-02-10 ENCOUNTER — Encounter: Admitting: Medical-Surgical

## 2024-02-25 ENCOUNTER — Other Ambulatory Visit: Payer: Self-pay | Admitting: Medical-Surgical

## 2024-02-25 ENCOUNTER — Ambulatory Visit (INDEPENDENT_AMBULATORY_CARE_PROVIDER_SITE_OTHER): Admitting: Medical-Surgical

## 2024-02-25 ENCOUNTER — Encounter: Payer: Self-pay | Admitting: Medical-Surgical

## 2024-02-25 VITALS — BP 93/63 | HR 86 | Resp 20 | Ht 65.0 in | Wt 189.0 lb

## 2024-02-25 DIAGNOSIS — J302 Other seasonal allergic rhinitis: Secondary | ICD-10-CM | POA: Insufficient documentation

## 2024-02-25 DIAGNOSIS — Z0000 Encounter for general adult medical examination without abnormal findings: Secondary | ICD-10-CM | POA: Diagnosis not present

## 2024-02-25 DIAGNOSIS — R7303 Prediabetes: Secondary | ICD-10-CM | POA: Diagnosis not present

## 2024-02-25 DIAGNOSIS — Z124 Encounter for screening for malignant neoplasm of cervix: Secondary | ICD-10-CM

## 2024-02-25 DIAGNOSIS — F411 Generalized anxiety disorder: Secondary | ICD-10-CM

## 2024-02-25 DIAGNOSIS — Z1231 Encounter for screening mammogram for malignant neoplasm of breast: Secondary | ICD-10-CM

## 2024-02-25 DIAGNOSIS — E7849 Other hyperlipidemia: Secondary | ICD-10-CM

## 2024-02-25 MED ORDER — MONTELUKAST SODIUM 10 MG PO TABS: 10.0000 mg | ORAL_TABLET | Freq: Every day | ORAL | 3 refills | Status: DC

## 2024-02-25 MED ORDER — ESCITALOPRAM OXALATE 20 MG PO TABS: 20.0000 mg | ORAL_TABLET | Freq: Every day | ORAL | 3 refills | Status: DC

## 2024-02-25 MED ORDER — BUPROPION HCL ER (XL) 150 MG PO TB24: 150.0000 mg | ORAL_TABLET | Freq: Every day | ORAL | 3 refills | Status: DC

## 2024-02-25 MED ORDER — LEVOCETIRIZINE DIHYDROCHLORIDE 5 MG PO TABS: 5.0000 mg | ORAL_TABLET | Freq: Every evening | ORAL | 3 refills | Status: DC

## 2024-02-25 MED ORDER — ROSUVASTATIN CALCIUM 20 MG PO TABS: 20.0000 mg | ORAL_TABLET | Freq: Every day | ORAL | 3 refills | Status: DC

## 2024-02-25 NOTE — Patient Instructions (Signed)
 Preventive Care 16-41 Years Old, Female  Preventive care refers to lifestyle choices and visits with your health care provider that can promote health and wellness. Preventive care visits are also called wellness exams.  What can I expect for my preventive care visit?  Counseling  Your health care provider may ask you questions about your:  Medical history, including:  Past medical problems.  Family medical history.  Pregnancy history.  Current health, including:  Menstrual cycle.  Method of birth control.  Emotional well-being.  Home life and relationship well-being.  Sexual activity and sexual health.  Lifestyle, including:  Alcohol, nicotine or tobacco, and drug use.  Access to firearms.  Diet, exercise, and sleep habits.  Work and work Astronomer.  Sunscreen use.  Safety issues such as seatbelt and bike helmet use.  Physical exam  Your health care provider will check your:  Height and weight. These may be used to calculate your BMI (body mass index). BMI is a measurement that tells if you are at a healthy weight.  Waist circumference. This measures the distance around your waistline. This measurement also tells if you are at a healthy weight and may help predict your risk of certain diseases, such as type 2 diabetes and high blood pressure.  Heart rate and blood pressure.  Body temperature.  Skin for abnormal spots.  What immunizations do I need?    Vaccines are usually given at various ages, according to a schedule. Your health care provider will recommend vaccines for you based on your age, medical history, and lifestyle or other factors, such as travel or where you work.  What tests do I need?  Screening  Your health care provider may recommend screening tests for certain conditions. This may include:  Lipid and cholesterol levels.  Diabetes screening. This is done by checking your blood sugar (glucose) after you have not eaten for a while (fasting).  Pelvic exam and Pap test.  Hepatitis B test.  Hepatitis C  test.  HIV (human immunodeficiency virus) test.  STI (sexually transmitted infection) testing, if you are at risk.  Lung cancer screening.  Colorectal cancer screening.  Mammogram. Talk with your health care provider about when you should start having regular mammograms. This may depend on whether you have a family history of breast cancer.  BRCA-related cancer screening. This may be done if you have a family history of breast, ovarian, tubal, or peritoneal cancers.  Bone density scan. This is done to screen for osteoporosis.  Talk with your health care provider about your test results, treatment options, and if necessary, the need for more tests.  Follow these instructions at home:  Eating and drinking    Eat a diet that includes fresh fruits and vegetables, whole grains, lean protein, and low-fat dairy products.  Take vitamin and mineral supplements as recommended by your health care provider.  Do not drink alcohol if:  Your health care provider tells you not to drink.  You are pregnant, may be pregnant, or are planning to become pregnant.  If you drink alcohol:  Limit how much you have to 0-1 drink a day.  Know how much alcohol is in your drink. In the [place] one drink equals one 12 oz bottle of beer (355 mL), one 5 oz glass of wine (148 mL), or one 1 oz glass of hard liquor (44 mL).  Lifestyle  Brush your teeth every morning and night with fluoride toothpaste. Floss one time each day.  Exercise for at least  30 minutes 5 or more days each week.  Do not use any products that contain nicotine or tobacco. These products include cigarettes, chewing tobacco, and vaping devices, such as e-cigarettes. If you need help quitting, ask your health care provider.  Do not use drugs.  If you are sexually active, practice safe sex. Use a condom or other form of protection to prevent STIs.  If you do not wish to become pregnant, use a form of birth control. If you plan to become pregnant, see your health care provider for a  prepregnancy visit.  Take aspirin only as told by your health care provider. Make sure that you understand how much to take and what form to take. Work with your health care provider to find out whether it is safe and beneficial for you to take aspirin daily.  Find healthy ways to manage stress, such as:  Meditation, yoga, or listening to music.  Journaling.  Talking to a trusted person.  Spending time with friends and family.  Minimize exposure to UV radiation to reduce your risk of skin cancer.  Safety  Always wear your seat belt while driving or riding in a vehicle.  Do not drive:  If you have been drinking alcohol. Do not ride with someone who has been drinking.  When you are tired or distracted.  While texting.  If you have been using any mind-altering substances or drugs.  Wear a helmet and other protective equipment during sports activities.  If you have firearms in your house, make sure you follow all gun safety procedures.  Seek help if you have been physically or sexually abused.  What's next?  Visit your health care provider once a year for an annual wellness visit.  Ask your health care provider how often you should have your eyes and teeth checked.  Stay up to date on all vaccines.  This information is not intended to replace advice given to you by your health care provider. Make sure you discuss any questions you have with your health care provider.  Document Revised: 04/04/2021 Document Reviewed: 04/04/2021  Elsevier Patient Education  2024 [employer].

## 2024-02-25 NOTE — Progress Notes (Signed)
 Complete physical exam  Patient: Anna Hines   DOB: 12/07/1980   43 y.o. Female  MRN: [id number]  Subjective:    Chief Complaint  Patient presents with   Annual Exam    Anna Hines is a 43 y.o. female who presents today for a complete physical exam. She reports consuming a general diet.  Goes to the gym three times weekly.   She generally feels fairly well. She reports sleeping fairly well. She does have additional problems to discuss today.   Most recent fall risk assessment:    06/25/2023    8:41 AM  Fall Risk   Falls in the past year? 0  Number falls in past yr: 0  Injury with Fall? 0  Risk for fall due to : No Fall Risks  Follow up Falls evaluation completed     Most recent depression screenings:    06/25/2023    8:41 AM 11/05/2022    9:29 AM  PHQ 2/9 Scores  PHQ - 2 Score 0 0  PHQ- 9 Score 1     Vision:Within last year and Dental: No current dental problems and Receives regular dental care    Patient Care Team: Cherre Cornish, NP as PCP - General (Nurse Practitioner)   Outpatient Medications Prior to Visit  Medication Sig   ALPRAZolam  (XANAX ) 0.5 MG tablet Take 0.5-1 tablets (0.25-0.5 mg total) by mouth 2 (two) times daily as needed for anxiety (Use sparingly to avoid tolerance/dependence).   Multiple Vitamins-Minerals (MULTIVITAMIN ADULT PO) Take 1 tablet by mouth daily.   norethindrone  (MICRONOR ) 0.35 MG tablet Take 1 tablet (0.35 mg total) by mouth daily.   [DISCONTINUED] buPROPion  (WELLBUTRIN  XL) 150 MG 24 hr tablet Take 1 tablet (150 mg total) by mouth daily.   [DISCONTINUED] escitalopram  (LEXAPRO ) 20 MG tablet TAKE 1 TABLET BY MOUTH DAILY   [DISCONTINUED] levocetirizine (XYZAL ) 5 MG tablet TAKE 1 TABLET BY MOUTH IN THE  EVENING   [DISCONTINUED] rosuvastatin  (CRESTOR ) 20 MG tablet Take 1 tablet (20 mg total) by mouth daily. NEEDS APPOINTMENT FOR FURTHER REFILLS.   [DISCONTINUED] phentermine (ADIPEX-P) 37.5 MG tablet Take 37.5 mg by mouth daily.  (Patient not taking: Reported on 02/25/2024)   No facility-administered medications prior to visit.    Review of Systems  Constitutional:  Negative for chills, fever, malaise/fatigue and weight loss.  HENT:  Positive for congestion. Negative for ear pain, hearing loss, sinus pain and sore throat.   Eyes:  Negative for blurred vision, photophobia and pain.  Respiratory:  Positive for shortness of breath and wheezing. Negative for sputum production.   Cardiovascular:  Negative for chest pain, palpitations and leg swelling.  Gastrointestinal:  Negative for abdominal pain, blood in stool, constipation, diarrhea, heartburn, melena, nausea and vomiting.  Genitourinary:  Negative for dysuria, frequency, hematuria and urgency.  Musculoskeletal:  Negative for falls and neck pain.  Skin:  Negative for itching and rash.  Neurological:  Negative for dizziness, weakness and headaches.  Endo/Heme/Allergies:  Negative for polydipsia. Does not bruise/bleed easily.  Psychiatric/Behavioral:  Negative for depression, substance abuse and suicidal ideas. The patient is not nervous/anxious and does not have insomnia.      Objective:    BP 93/63 (BP Location: Right Arm, Cuff Size: Normal)   Pulse 86   Resp 20   Ht 5\' 5"  (1.651 m)   Wt 189 lb 0.6 oz (85.7 kg)   SpO2 95%   BMI 31.46 kg/m    Physical Exam Constitutional:  General: She is not in acute distress.    Appearance: Normal appearance. She is not ill-appearing.  HENT:     Head: Normocephalic and atraumatic.     Right Ear: Tympanic membrane, ear canal and external ear normal. There is no impacted cerumen.     Left Ear: Tympanic membrane, ear canal and external ear normal. There is no impacted cerumen.     Nose: Nose normal. No congestion or rhinorrhea.     Mouth/Throat:     Mouth: Mucous membranes are moist.     Pharynx: No oropharyngeal exudate or posterior oropharyngeal erythema.  Eyes:     General: No scleral icterus.       Right eye:  No discharge.        Left eye: No discharge.     Extraocular Movements: Extraocular movements intact.     Conjunctiva/sclera: Conjunctivae normal.     Pupils: Pupils are equal, round, and reactive to light.  Neck:     Thyroid: No thyromegaly.     Vascular: No carotid bruit or JVD.     Trachea: Trachea normal.  Cardiovascular:     Rate and Rhythm: Normal rate and regular rhythm.     Pulses: Normal pulses.     Heart sounds: Normal heart sounds. No murmur heard.    No friction rub. No gallop.  Pulmonary:     Effort: Pulmonary effort is normal. No respiratory distress.     Breath sounds: Normal breath sounds. No wheezing.  Abdominal:     General: Bowel sounds are normal. There is no distension.     Palpations: Abdomen is soft.     Tenderness: There is no abdominal tenderness. There is no guarding.  Musculoskeletal:        General: Normal range of motion.     Cervical back: Normal range of motion and neck supple.  Lymphadenopathy:     Cervical: No cervical adenopathy.  Skin:    General: Skin is warm and dry.  Neurological:     Mental Status: She is alert and oriented to person, place, and time.     Cranial Nerves: No cranial nerve deficit.  Psychiatric:        Mood and Affect: Mood normal.        Behavior: Behavior normal.        Thought Content: Thought content normal.        Judgment: Judgment normal.    No results found for any visits on 02/25/24.     Assessment & Plan:    Routine Health Maintenance and Physical Exam  Immunization History  Administered Date(s) Administered   Influenza,inj,Quad PF,6+ Mos 06/16/2018, 11/05/2022   Influenza-Unspecified 07/21/2016   PFIZER(Purple Top)SARS-COV-2 Vaccination 03/24/2020, 04/21/2020   Tdap 10/30/2018    Health Maintenance  Topic Date Due   Cervical Cancer Screening (HPV/Pap Cotest)  06/18/2017   COVID-19 Vaccine (3 - Pfizer risk series) 03/12/2024 (Originally 05/19/2020)   INFLUENZA VACCINE  05/21/2024   DTaP/Tdap/Td (2  - Td or Tdap) 10/30/2028   Hepatitis C Screening  Completed   HIV Screening  Completed   HPV VACCINES  Aged Out   Meningococcal B Vaccine  Aged Out    Discussed health benefits of physical activity, and encouraged her to engage in regular exercise appropriate for her age and condition.  1. Annual physical exam (Primary) Checking labs as below. UTD on preventative care. Wellness information provided with AVS. - CBC with Differential/Platelet - CMP14+EGFR - Lipid panel  2. Generalized anxiety disorder  Stable. Continue Lexapro  and Wellbutrin .  - buPROPion  (WELLBUTRIN  XL) 150 MG 24 hr tablet; Take 1 tablet (150 mg total) by mouth daily.  Dispense: 90 tablet; Refill: 3 - escitalopram  (LEXAPRO ) 20 MG tablet; Take 1 tablet (20 mg total) by mouth daily.  Dispense: 90 tablet; Refill: 3  3. Seasonal allergies Not well controlled. Continue Xyzal . Adding Singulair  10mg  nightly.  - levocetirizine (XYZAL ) 5 MG tablet; Take 1 tablet (5 mg total) by mouth every evening.  Dispense: 90 tablet; Refill: 3  4. Familial hyperlipidemia Checking labs. Continue Crestor  as prescribed.  - CMP14+EGFR - Lipid panel - rosuvastatin  (CRESTOR ) 20 MG tablet; Take 1 tablet (20 mg total) by mouth daily.  Dispense: 90 tablet; Refill: 3  5. Prediabetes Checking A1c.  - Hemoglobin A1c  6. Cervical cancer screening Referring to OB/GYN per patient request.  - Ambulatory referral to Obstetrics / Gynecology  7. Encounter for screening mammogram for malignant neoplasm of breast Mammogram ordered.  - MM DIGITAL SCREENING BILATERAL; Future  Return in about 1 year (around 02/24/2025) for annual physical exam or sooner if needed.   Rosaly Labarbera, NP

## 2024-02-26 ENCOUNTER — Encounter: Payer: Self-pay | Admitting: Medical-Surgical

## 2024-02-26 ENCOUNTER — Other Ambulatory Visit: Payer: Self-pay | Admitting: Medical-Surgical

## 2024-02-26 LAB — CMP14+EGFR
ALT: 24 IU/L (ref 0–32)
AST: 18 IU/L (ref 0–40)
Albumin: 4.5 g/dL (ref 3.9–4.9)
Alkaline Phosphatase: 81 IU/L (ref 44–121)
BUN/Creatinine Ratio: 16 (ref 9–23)
BUN: 10 mg/dL (ref 6–24)
Bilirubin Total: 0.2 mg/dL (ref 0.0–1.2)
CO2: 21 mmol/L (ref 20–29)
Calcium: 9.8 mg/dL (ref 8.7–10.2)
Chloride: 103 mmol/L (ref 96–106)
Creatinine, Ser: 0.62 mg/dL (ref 0.57–1.00)
Globulin, Total: 2.1 g/dL (ref 1.5–4.5)
Glucose: 97 mg/dL (ref 70–99)
Potassium: 4.4 mmol/L (ref 3.5–5.2)
Sodium: 140 mmol/L (ref 134–144)
Total Protein: 6.6 g/dL (ref 6.0–8.5)
eGFR: 115 mL/min/{1.73_m2} (ref >59)

## 2024-02-26 LAB — CBC WITH DIFFERENTIAL/PLATELET
Basophils Absolute: 0.1 10*3/uL (ref 0.0–0.2)
Basos: 1 %
EOS (ABSOLUTE): 0.6 10*3/uL — ABNORMAL HIGH (ref 0.0–0.4)
Eos: 8 %
Hematocrit: 44.5 % (ref 34.0–46.6)
Hemoglobin: 14.8 g/dL (ref 11.1–15.9)
Immature Grans (Abs): 0 10*3/uL (ref 0.0–0.1)
Immature Granulocytes: 0 %
Lymphocytes Absolute: 2.2 10*3/uL (ref 0.7–3.1)
Lymphs: 29 %
MCH: 31.8 pg (ref 26.6–33.0)
MCHC: 33.3 g/dL (ref 31.5–35.7)
MCV: 96 fL (ref 79–97)
Monocytes Absolute: 0.5 10*3/uL (ref 0.1–0.9)
Monocytes: 7 %
Neutrophils Absolute: 4.1 10*3/uL (ref 1.4–7.0)
Neutrophils: 55 %
Platelets: 271 10*3/uL (ref 150–450)
RBC: 4.66 x10E6/uL (ref 3.77–5.28)
RDW: 12.2 % (ref 11.7–15.4)
WBC: 7.5 10*3/uL (ref 3.4–10.8)

## 2024-02-26 LAB — LIPID PANEL
Chol/HDL Ratio: 3.4 ratio (ref 0.0–4.4)
Cholesterol, Total: 138 mg/dL (ref 100–199)
HDL: 41 mg/dL (ref >39)
LDL Chol Calc (NIH): 80 mg/dL (ref 0–99)
Triglycerides: 87 mg/dL (ref 0–149)
VLDL Cholesterol Cal: 17 mg/dL (ref 5–40)

## 2024-02-26 LAB — HEMOGLOBIN A1C
Est. average glucose Bld gHb Est-mCnc: 114 mg/dL
Hgb A1c MFr Bld: 5.6 % (ref 4.8–5.6)

## 2024-03-06 ENCOUNTER — Other Ambulatory Visit: Payer: Self-pay | Admitting: Medical-Surgical

## 2024-03-06 DIAGNOSIS — E7849 Other hyperlipidemia: Secondary | ICD-10-CM

## 2024-03-20 ENCOUNTER — Other Ambulatory Visit: Payer: Self-pay | Admitting: Medical-Surgical

## 2024-03-20 DIAGNOSIS — F411 Generalized anxiety disorder: Secondary | ICD-10-CM

## 2024-03-25 ENCOUNTER — Ambulatory Visit

## 2024-03-31 ENCOUNTER — Ambulatory Visit

## 2024-04-14 ENCOUNTER — Ambulatory Visit

## 2024-04-17 ENCOUNTER — Other Ambulatory Visit: Payer: Self-pay | Admitting: Medical-Surgical

## 2024-04-17 DIAGNOSIS — J302 Other seasonal allergic rhinitis: Secondary | ICD-10-CM

## 2024-04-17 DIAGNOSIS — E7849 Other hyperlipidemia: Secondary | ICD-10-CM

## 2024-04-26 ENCOUNTER — Encounter: Payer: Self-pay | Admitting: Medical-Surgical

## 2024-04-27 ENCOUNTER — Other Ambulatory Visit: Payer: Self-pay

## 2024-04-27 MED ORDER — MONTELUKAST SODIUM 10 MG PO TABS: 10.0000 mg | ORAL_TABLET | Freq: Every day | ORAL | 1 refills | Status: AC

## 2024-04-27 MED ORDER — NORETHINDRONE 0.35 MG PO TABS: 1.0000 | ORAL_TABLET | Freq: Every day | ORAL | 1 refills | Status: AC

## 2024-05-06 ENCOUNTER — Ambulatory Visit

## 2024-05-06 DIAGNOSIS — Z1231 Encounter for screening mammogram for malignant neoplasm of breast: Secondary | ICD-10-CM

## 2024-05-10 ENCOUNTER — Ambulatory Visit: Payer: Self-pay | Admitting: Medical-Surgical

## 2024-05-17 ENCOUNTER — Other Ambulatory Visit: Payer: Self-pay | Admitting: Medical Genetics

## 2024-06-08 ENCOUNTER — Ambulatory Visit: Admitting: Medical-Surgical

## 2024-06-08 ENCOUNTER — Encounter: Payer: Self-pay | Admitting: Medical-Surgical

## 2024-06-08 VITALS — BP 110/72 | HR 91 | Resp 20 | Ht 65.0 in | Wt 206.0 lb

## 2024-06-08 DIAGNOSIS — E66811 Obesity, class 1: Secondary | ICD-10-CM | POA: Diagnosis not present

## 2024-06-08 MED ORDER — ZEPBOUND 2.5 MG/0.5ML ~~LOC~~ SOAJ: 2.5000 mg | SUBCUTANEOUS | 0 refills | Status: AC

## 2024-06-08 NOTE — Progress Notes (Signed)
 Established patient visit   History of Present Illness   Discussed the use of AI scribe software for clinical note transcription with the patient, who gave verbal consent to proceed.  History of Present Illness   Anna Hines is a 43 year old female who presents for evaluation of weight management and thyroid function.  Weight gain and management - Current weight is 206 pounds, which is her heaviest recorded weight - Weight was 199 pounds five years ago after her daughter's birth - Difficulty losing weight despite structured diet and regular exercise, particularly since age 78 - Physical activity includes 10,000 steps daily and strength training two to three times per week - Diet consists of a protein bar and shake for breakfast, chicken and vegetable soup for lunch, and a salad with cheese and light dressing for dinner - Caloric intake is tracked, aiming for 1400 to 1500 calories per day - Protein intake is approximately 50 grams for breakfast - Occasional consumption of ice cream when feeling discouraged - Maintains a healthy diet for her family, avoiding snacks and unhealthy foods at home - Concerned about possible thyroid dysfunction, as suggested by her aunt - Interested in evaluation of hemoglobin A1c levels      Physical Exam   Physical Exam Vitals reviewed.  Constitutional:      General: She is not in acute distress.    Appearance: Normal appearance. She is obese. She is not ill-appearing.  HENT:     Head: Normocephalic and atraumatic.  Cardiovascular:     Rate and Rhythm: Normal rate and regular rhythm.     Pulses: Normal pulses.     Heart sounds: Normal heart sounds. No murmur heard.    No friction rub. No gallop.  Pulmonary:     Effort: Pulmonary effort is normal. No respiratory distress.     Breath sounds: Normal breath sounds. No wheezing.  Skin:    General: Skin is warm and dry.  Neurological:     Mental Status: She is alert and oriented to  person, place, and time.  Psychiatric:        Mood and Affect: Mood normal.        Behavior: Behavior normal.        Thought Content: Thought content normal.        Judgment: Judgment normal.     Assessment & Plan   Assessment and Plan    Obesity with comorbid prediabetes and hyperlipidemia Class 1 obesity with BMI 34.28. Comorbid prediabetes and hyperlipidemia. Discussed potential thyroid and insulin  resistance impact on weight. Preference for Zepbound  due to dual GLP-1 and GIP receptor action. Insurance and cost options reviewed. - Order TSH, A1c, and random insulin  level tests. - Submit insurance request for Zepbound . - Consider [employer] self-pay program or compounding pharmacy if insurance does not cover. - Follow up in 4 weeks to assess response and adjust dosage.  Contraceptive management for menstrual regulation Using birth control pills for menstrual regulation. No current contraception need. Discussed potential interaction with new weight loss medication. - Advise condom use during first week of weight loss medication if sexually active with female partner.   Patient has tried dietary and lifestyle modifications, including caloric restrictions, weight loss behavior, modifications, and increased physical activity (at least 150 minutes per week) for more than 6 months. The patient has been unable to lose 5% of their body weight. It is medically necessary for the patient to add pharmacotherapy to their  obesity treatment plan at this time. The patient will continue lifestyle modifications, physical activity, and caloric restrictions while on therapy. The current BMI is 34.28 and body weight is 206lbs prior to starting medication to treat their obesity. Pharmacotherapy is recommended following AACE guidelines. Patient is not taking another GLP-1 receptor agonist and is not pregnant/lactating. There are no contraindications.      Follow up   Return in about 4 weeks (around 07/06/2024)  for weight check if able to obtain Zepbound .  __________________________________ Zada FREDRIK Palin, DNP, APRN, FNP-BC Primary Care and Sports Medicine [facility]

## 2024-06-09 ENCOUNTER — Ambulatory Visit: Payer: Self-pay | Admitting: Medical-Surgical

## 2024-06-09 LAB — INSULIN, RANDOM: INSULIN: 12.9 u[IU]/mL (ref 2.6–24.9)

## 2024-06-09 LAB — HEMOGLOBIN A1C
Est. average glucose Bld gHb Est-mCnc: 105 mg/dL
Hgb A1c MFr Bld: 5.3 % (ref 4.8–5.6)

## 2024-06-09 LAB — TSH: TSH: 1.65 u[IU]/mL (ref 0.450–4.500)

## 2024-06-14 ENCOUNTER — Telehealth: Payer: Self-pay

## 2024-06-14 ENCOUNTER — Other Ambulatory Visit (HOSPITAL_COMMUNITY): Payer: Self-pay

## 2024-06-14 NOTE — Telephone Encounter (Signed)
 Pharmacy Patient Advocate Encounter   Received notification from Onbase that prior authorization for Zepbound  2.5 mg/0.5 ml pen is required/requested.   Insurance verification completed.   The patient is insured through [facility] .   Per test claim: product/service not covered

## 2024-06-23 ENCOUNTER — Other Ambulatory Visit: Payer: Self-pay | Admitting: Medical-Surgical

## 2024-06-23 DIAGNOSIS — F411 Generalized anxiety disorder: Secondary | ICD-10-CM

## 2024-07-22 ENCOUNTER — Other Ambulatory Visit: Payer: Self-pay | Admitting: Medical-Surgical

## 2024-07-22 DIAGNOSIS — F411 Generalized anxiety disorder: Secondary | ICD-10-CM

## 2024-08-04 ENCOUNTER — Other Ambulatory Visit: Payer: Self-pay | Admitting: Medical Genetics

## 2024-08-04 DIAGNOSIS — Z006 Encounter for examination for normal comparison and control in clinical research program: Secondary | ICD-10-CM

## 2024-09-06 ENCOUNTER — Telehealth: Payer: Self-pay

## 2024-09-06 ENCOUNTER — Other Ambulatory Visit (HOSPITAL_COMMUNITY): Payer: Self-pay

## 2024-09-06 NOTE — Telephone Encounter (Signed)
*  Fam med  Pharmacy Patient Advocate Encounter   Received notification from Fax that prior authorization for Levocetirizine tablets is required/requested.   Insurance verification completed.   The patient is insured through [facility].   Per test claim:  Cetirizine  liquid/Azelastine spray/Flunisolide spray is preferred by the insurance.  If suggested medication is appropriate, Please send in a new RX and discontinue this one. If not, please advise as to why it's not appropriate so that we may request a Prior Authorization. Please note, some preferred medications may still require a PA.  If the suggested medications have not been trialed and there are no contraindications to their use, the PA will not be submitted, as it will not be approved.

## 2024-09-18 ENCOUNTER — Other Ambulatory Visit: Payer: Self-pay | Admitting: Medical-Surgical

## 2024-09-22 NOTE — Telephone Encounter (Signed)
 No response, closing request.

## 2025-03-01 ENCOUNTER — Encounter: Admitting: Medical-Surgical
